# Patient Record
Sex: Female | Born: 1980 | Race: Black or African American | Hispanic: No | Smoking: Never smoker
Health system: Southern US, Community
[De-identification: ages and names within clinical notes are randomized; demographics above are authoritative.]

## PROBLEM LIST (undated history)

## (undated) DIAGNOSIS — D649 Anemia, unspecified: Secondary | ICD-10-CM

## (undated) DIAGNOSIS — B351 Tinea unguium: Secondary | ICD-10-CM

## (undated) DIAGNOSIS — E119 Type 2 diabetes mellitus without complications: Secondary | ICD-10-CM

## (undated) DIAGNOSIS — F419 Anxiety disorder, unspecified: Secondary | ICD-10-CM

## (undated) DIAGNOSIS — R011 Cardiac murmur, unspecified: Secondary | ICD-10-CM

## (undated) HISTORY — PX: NO PAST SURGERIES: SHX2092

---

## 2013-09-22 ENCOUNTER — Emergency Department (HOSPITAL_COMMUNITY)
Admission: EM | Admit: 2013-09-22 | Discharge: 2013-09-22 | Disposition: A | Discharge status: Home / Self Care | Payer: Medicaid - Out of State | Attending: Emergency Medicine | Admitting: Emergency Medicine

## 2013-09-22 ENCOUNTER — Emergency Department (HOSPITAL_COMMUNITY): Payer: Medicaid - Out of State

## 2013-09-22 ENCOUNTER — Encounter (HOSPITAL_COMMUNITY): Payer: Self-pay | Admitting: Emergency Medicine

## 2013-09-22 DIAGNOSIS — O209 Hemorrhage in early pregnancy, unspecified: Secondary | ICD-10-CM | POA: Insufficient documentation

## 2013-09-22 LAB — BASIC METABOLIC PANEL
CO2: 24 mEq/L (ref 19–32)
Chloride: 98 mEq/L (ref 96–112)
Creatinine, Ser: 0.51 mg/dL (ref 0.50–1.10)
GFR calc Af Amer: >90 mL/min (ref >90)
Glucose, Bld: 269 mg/dL — ABNORMAL HIGH (ref 70–99)
Sodium: 136 mEq/L — ABNORMAL LOW (ref 137–147)

## 2013-09-22 LAB — URINALYSIS, ROUTINE W REFLEX MICROSCOPIC
Glucose, UA: >1000 mg/dL — AB
Ketones, ur: 40 mg/dL — AB
Specific Gravity, Urine: >1.046 — ABNORMAL HIGH (ref 1.005–1.030)
pH: 5.5 (ref 5.0–8.0)

## 2013-09-22 LAB — CBC WITH DIFFERENTIAL/PLATELET
Basophils Relative: 0 % (ref 0–1)
Eosinophils Absolute: 0.1 10*3/uL (ref 0.0–0.7)
Eosinophils Relative: 1 % (ref 0–5)
HCT: 42.6 % (ref 36.0–46.0)
Hemoglobin: 14.8 g/dL (ref 12.0–15.0)
MCH: 29.3 pg (ref 26.0–34.0)
MCHC: 34.7 g/dL (ref 30.0–36.0)
MCV: 84.4 fL (ref 78.0–100.0)
Monocytes Absolute: 0.6 10*3/uL (ref 0.1–1.0)
Monocytes Relative: 5 % (ref 3–12)
Neutro Abs: 8.2 10*3/uL — ABNORMAL HIGH (ref 1.7–7.7)
Neutrophils Relative %: 75 % (ref 43–77)
RDW: 13.9 % (ref 11.5–15.5)
WBC: 11 10*3/uL — ABNORMAL HIGH (ref 4.0–10.5)

## 2013-09-22 LAB — ABO/RH: Antibody Screen: NEGATIVE

## 2013-09-22 LAB — URINE MICROSCOPIC-ADD ON

## 2013-09-22 LAB — WET PREP, GENITAL
Clue Cells Wet Prep HPF POC: NONE SEEN
Yeast Wet Prep HPF POC: NONE SEEN

## 2013-09-22 MED ORDER — RHO D IMMUNE GLOBULIN 1500 UNIT/2ML IJ SOLN
300.0000 ug | Freq: Once | INTRAMUSCULAR | Status: AC
  Administered 2013-09-22: 300 ug via INTRAVENOUS

## 2013-09-22 MED ORDER — ACETAMINOPHEN 325 MG PO TABS
650.0000 mg | ORAL_TABLET | Freq: Once | ORAL | Status: AC
  Administered 2013-09-22: 650 mg via ORAL
  Filled 2013-09-22: qty 2

## 2013-09-22 NOTE — ED Provider Notes (Signed)
Medical screening examination/treatment/procedure(s) were performed by non-physician practitioner and as supervising physician I was immediately available for consultation/collaboration.  EKG Interpretation   None         Layla Maw Mykah Shin, DO 09/22/13 1626

## 2013-09-22 NOTE — ED Notes (Signed)
Pt c/o of vaginal bleeding that started a couple of days ago. States that she is [redacted] weeks pregnant. Also c/o of lower abd cramping. Denies n/v/d.

## 2013-09-22 NOTE — Progress Notes (Signed)
   CARE MANAGEMENT ED NOTE 09/22/2013  Patient:  [name], [name]   Account Number:  [id number]  Date Initiated:  09/22/2013  Documentation initiated by:  Edd Arbour  Subjective/Objective Assessment:   32 yr old female medicaid out of state [place] coverage Pt without pcp locally as confirmed by her Pt states she is "not sure" if she is relocating to the [employer] [place] c/o [redacted] weeks pregnant with vaginal bleeding for a couple of days     Subjective/Objective Assessment Detail:   confirms in [place] her pcp was Dr Concha Pyo in [place]     Action/Plan:   CM noted pt with Medicaid of [place] Cm spoke with pt about contacting DSS case worker to assist with medicaid transfer services if she plans to relocate to [facility]   Action/Plan Detail:   CM discussed with pt the need to verify with DSS who is pcp is, how to request a change of medicaid pcp & process for changing medicaid from county to county and from state to state  Pt voiced understanding   Anticipated DC Date:  09/22/2013     Status Recommendation to Physician:   Result of Recommendation:    Other ED Services  Consult Working Plan    DC Planning Services  Other  PCP issues  Outpatient Services - Pt will follow up    Choice offered to / List presented to:            Status of service:  Completed, signed off  ED Comments:   ED Comments Detail:  Cm provided pt with a list of [place] accepting [facility] providers CM discussed and provided written information for self pay pcps, importance of pcp for f/u care, www.needymeds.org, discounted pharmacies and other [place] resources such as financial assistance, DSS and  health department Reviewed resources for [employer] self pay pcps like [facility], family medicine at [address], [facility] family practice, general medical clinics, [facility] urgent care plus others, [facility] out patient pharmacies and housing Pt voiced understanding and appreciation of resources provided

## 2013-09-22 NOTE — ED Notes (Signed)
Patient transported to Ultrasound 

## 2013-09-22 NOTE — ED Provider Notes (Signed)
Early pregnancy with bleeding, requested to assist with bedside US to look for intrauterine pregnancy.  EMERGENCY DEPARTMENT [place] PREGNANCY "Study: Limited Ultrasound of the Pelvis for Pregnancy"  INDICATIONS:Pregnancy(required) and Vaginal bleeding Multiple views of the uterus and pelvic cavity were obtained in real-time with a multi-frequency probe.  APPROACH:Transabdominal   PERFORMED BY: Myself  IMAGES ARCHIVED?: Yes  LIMITATIONS: Body habitus  And empty bladder  PREGNANCY FREE FLUID: None  ADNEXAL FINDINGS: no free fluid seen  PREGNANCY FINDINGS: no intrauterine pregnancy visualized, no free fluid in abdomen  INTERPRETATION: No intrauterine preg, recommend formal transvaginal US; early pregnancy vs threatened abortion vs ectopic  Blane Ohara M 9:08 AM       Enid Skeens, MD 09/22/13 [phone]

## 2013-09-22 NOTE — ED Provider Notes (Signed)
CSN: [id number]     Arrival date & time 09/22/13  4098 History   First MD Initiated Contact with Patient 09/22/13 [phone]     Chief Complaint  Patient presents with  . Vaginal Bleeding  . Possible Pregnancy   (Consider location/radiation/quality/duration/timing/severity/associated sxs/prior Treatment) HPI Comments: Patient presents to the ED with a chief complaint of vaginal bleeding.  She states that she recently found out she is pregnant.  She states that LMP was 8 weeks ago.  She states that the bleeding started 2 days ago.  She states that she has not had any abdominal pain until getting to the ED today, but she thinks that she might just be nervous.  She denies any other vaginal discharge.  She denies any other symptoms.  The history is provided by the patient. No language interpreter was used.    No past medical history on file. No past surgical history on file. No family history on file. History  Substance Use Topics  . Smoking status: Not on file  . Smokeless tobacco: Not on file  . Alcohol Use: Not on file   OB History   No data available     Review of Systems  All other systems reviewed and are negative.    Allergies  Review of patient's allergies indicates not on file.  Home Medications  No current outpatient prescriptions on file. BP 140/89  Pulse 113  Temp(Src) 98.5 F (36.9 C) (Oral)  Resp 16  SpO2 100% Physical Exam  Nursing note and vitals reviewed. Constitutional: She is oriented to person, place, and time. She appears well-developed and well-nourished.  HENT:  Head: Normocephalic and atraumatic.  Eyes: Conjunctivae and EOM are normal. Pupils are equal, round, and reactive to light.  Neck: Normal range of motion. Neck supple.  Cardiovascular: Normal rate and regular rhythm.  Exam reveals no gallop and no friction rub.   No murmur heard. Pulmonary/Chest: Effort normal and breath sounds normal. No respiratory distress. She has no wheezes. She has no  rales. She exhibits no tenderness.  Abdominal: Soft. She exhibits no distension and no mass. There is no tenderness. There is no rebound and no guarding.  No focal abdominal tenderness  Genitourinary: No labial fusion. There is no rash, tenderness, lesion or injury on the right labia. There is no rash, tenderness, lesion or injury on the left labia. Uterus is not deviated, not enlarged, not fixed and not tender. Cervix exhibits no motion tenderness, no discharge and no friability. Right adnexum displays no mass, no tenderness and no fullness. Left adnexum displays no mass, no tenderness and no fullness. There is bleeding around the vagina. No erythema or tenderness around the vagina. No foreign body around the vagina. No signs of injury around the vagina. Vaginal discharge found.  Chaperone present for pelvic exam  Musculoskeletal: Normal range of motion. She exhibits no edema and no tenderness.  Neurological: She is alert and oriented to person, place, and time.  Skin: Skin is warm and dry.  Psychiatric: She has a normal mood and affect. Her behavior is normal. Judgment and thought content normal.    ED Course  Procedures (including critical care time) Results for orders placed during the hospital encounter of 09/22/13  CBC WITH DIFFERENTIAL      Result Value Range   WBC 11.0 (*) 4.0 - 10.5 K/uL   RBC 5.05  3.87 - 5.11 MIL/uL   Hemoglobin 14.8  12.0 - 15.0 g/dL   HCT 47.8  29.5 - 62.1 %  MCV 84.4  78.0 - 100.0 fL   MCH 29.3  26.0 - 34.0 pg   MCHC 34.7  30.0 - 36.0 g/dL   RDW 47.8  29.5 - 62.1 %   Platelets 309  150 - 400 K/uL   Neutrophils Relative % 75  43 - 77 %   Neutro Abs 8.2 (*) 1.7 - 7.7 K/uL   Lymphocytes Relative 19  12 - 46 %   Lymphs Abs 2.1  0.7 - 4.0 K/uL   Monocytes Relative 5  3 - 12 %   Monocytes Absolute 0.6  0.1 - 1.0 K/uL   Eosinophils Relative 1  0 - 5 %   Eosinophils Absolute 0.1  0.0 - 0.7 K/uL   Basophils Relative 0  0 - 1 %   Basophils Absolute 0.0  0.0 -  0.1 K/uL  HCG, QUANTITATIVE, PREGNANCY      Result Value Range   hCG, Beta Chain, Quant, S 681 (*) <5 mIU/mL  BASIC METABOLIC PANEL      Result Value Range   Sodium 136 (*) 137 - 147 mEq/L   Potassium 4.0  3.7 - 5.3 mEq/L   Chloride 98  96 - 112 mEq/L   CO2 24  19 - 32 mEq/L   Glucose, Bld 269 (*) 70 - 99 mg/dL   BUN 10  6 - 23 mg/dL   Creatinine, Ser 3.08  0.50 - 1.10 mg/dL   Calcium 9.4  8.4 - 65.7 mg/dL   GFR calc non Af Amer >90  >90 mL/min   GFR calc Af Amer >90  >90 mL/min  URINALYSIS, ROUTINE W REFLEX MICROSCOPIC      Result Value Range   Color, Urine AMBER (*) YELLOW   APPearance CLOUDY (*) CLEAR   Specific Gravity, Urine >1.046 (*) 1.005 - 1.030   pH 5.5  5.0 - 8.0   Glucose, UA >1000 (*) NEGATIVE mg/dL   Hgb urine dipstick LARGE (*) NEGATIVE   Bilirubin Urine SMALL (*) NEGATIVE   Ketones, ur 40 (*) NEGATIVE mg/dL   Protein, ur 30 (*) NEGATIVE mg/dL   Urobilinogen, UA 1.0  0.0 - 1.0 mg/dL   Nitrite NEGATIVE  NEGATIVE   Leukocytes, UA SMALL (*) NEGATIVE  URINE MICROSCOPIC-ADD ON      Result Value Range   Squamous Epithelial / LPF MANY (*) RARE   WBC, UA 11-20  <3 WBC/hpf   RBC / HPF TOO NUMEROUS TO COUNT  <3 RBC/hpf   Bacteria, UA FEW (*) RARE   Urine-Other MUCOUS PRESENT    POCT PREGNANCY, URINE      Result Value Range   Preg Test, Ur POSITIVE (*) NEGATIVE  ABO/RH      Result Value Range   ABO/RH(D) O NEG     Antibody Screen NEG     US Ob Comp Less 14 Wks  09/22/2013   CLINICAL DATA:  Vaginal bleeding.  Possible pregnancy.  EXAM: OBSTETRIC <14 WK [place] AND TRANSVAGINAL OB US  TECHNIQUE: Both transabdominal and transvaginal ultrasound examinations were performed for complete evaluation of the gestation as well as the maternal uterus, adnexal regions, and pelvic cul-de-sac. Transvaginal technique was performed to assess early pregnancy.  COMPARISON:  None.  FINDINGS: Intrauterine gestational sac: No intrauterine gestational sac.  Yolk sac:  None  Embryo:  None   Cardiac Activity: None  Heart Rate:  None  Maternal uterus/adnexae: Uterus itself appears normal. No evidence of leiomyoma. Endometrium appears uniform without fluid in the endometrial canal. Endometrium may  be slightly proliferative.  Left ovary appears normal measuring 3.1 x 1.9 x 2.6 cm.  Right ovary measures 3.3 x 1.5 x 1.5 cm. Immediately adjacent to the right ovary, there is an echogenic area measuring approximately 2.0 x 1.2 x 1.5 cm with a cystic area within it. The differential diagnosis for this is hemorrhagic ovarian cyst in evolution versus the possibility of an ectopic pregnancy. Follow-up is suggested. Internal blood flow is not demonstrated, somewhat favoring hemorrhagic cyst.  There is a trace amount of free fluid.  IMPRESSION: No evidence of intrauterine pregnancy.  Normal appearing left ovary.  2 x 1.2 x 1.5 cm focus immediately adjacent to the right ovary which is largely echogenic but contains a small cystic area. This could represent an evolving hemorrhagic cyst. However, ectopic pregnancy is not excluded. Follow-up suggested.   Electronically Signed   By: Paulina Fusi M.D.   On: 09/22/2013 10:53   US Ob Transvaginal  09/22/2013   CLINICAL DATA:  Vaginal bleeding.  Possible pregnancy.  EXAM: OBSTETRIC <14 WK [place] AND TRANSVAGINAL OB US  TECHNIQUE: Both transabdominal and transvaginal ultrasound examinations were performed for complete evaluation of the gestation as well as the maternal uterus, adnexal regions, and pelvic cul-de-sac. Transvaginal technique was performed to assess early pregnancy.  COMPARISON:  None.  FINDINGS: Intrauterine gestational sac: No intrauterine gestational sac.  Yolk sac:  None  Embryo:  None  Cardiac Activity: None  Heart Rate:  None  Maternal uterus/adnexae: Uterus itself appears normal. No evidence of leiomyoma. Endometrium appears uniform without fluid in the endometrial canal. Endometrium may be slightly proliferative.  Left ovary appears normal measuring 3.1 x  1.9 x 2.6 cm.  Right ovary measures 3.3 x 1.5 x 1.5 cm. Immediately adjacent to the right ovary, there is an echogenic area measuring approximately 2.0 x 1.2 x 1.5 cm with a cystic area within it. The differential diagnosis for this is hemorrhagic ovarian cyst in evolution versus the possibility of an ectopic pregnancy. Follow-up is suggested. Internal blood flow is not demonstrated, somewhat favoring hemorrhagic cyst.  There is a trace amount of free fluid.  IMPRESSION: No evidence of intrauterine pregnancy.  Normal appearing left ovary.  2 x 1.2 x 1.5 cm focus immediately adjacent to the right ovary which is largely echogenic but contains a small cystic area. This could represent an evolving hemorrhagic cyst. However, ectopic pregnancy is not excluded. Follow-up suggested.   Electronically Signed   By: Paulina Fusi M.D.   On: 09/22/2013 10:53      EKG Interpretation   None       MDM   1. Vaginal bleeding in pregnancy, first trimester      Patient has positive pregnancy test with vaginal bleeding.  Rh-, will give rhogam.  US shows probable cyst, but ectopic not excluded.  Probable miscarriage. [facility].  Discussed the patient with Dr. Elesa Massed, who agrees with the plan.    Roxy Horseman, PA-C 09/22/13 [phone]

## 2013-09-23 LAB — RH IG WORKUP (INCLUDES ABO/RH)
ABO/RH(D): O NEG
Unit division: 0

## 2013-09-23 LAB — URINE CULTURE: Colony Count: >=100000

## 2014-07-26 ENCOUNTER — Encounter (HOSPITAL_COMMUNITY): Payer: Self-pay | Admitting: Emergency Medicine

## 2014-11-18 IMAGING — US US OB COMP LESS 14 WK
1 series · 13 of 28 positions shown · non-contrast
Comparison: None.

CLINICAL DATA: Vaginal bleeding.  Possible pregnancy.

EXAM:
OBSTETRIC <14 WK US AND TRANSVAGINAL OB US
TECHNIQUE: Both transabdominal and transvaginal ultrasound examinations were
performed for complete evaluation of the gestation as well as the
maternal uterus, adnexal regions, and pelvic cul-de-sac.
Transvaginal technique was performed to assess early pregnancy.

[Series 1: us ob comp less 14 wk · 0.20mm/px · 13 of 56 slices shown]
[im 3/56]
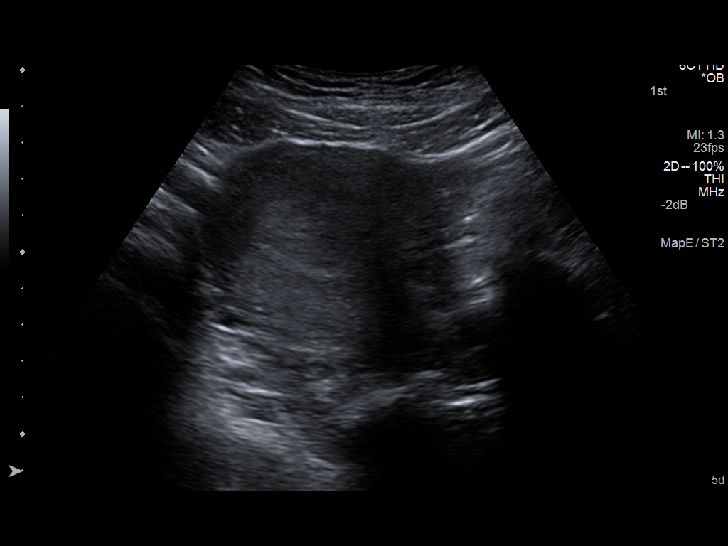
[im 7/56]
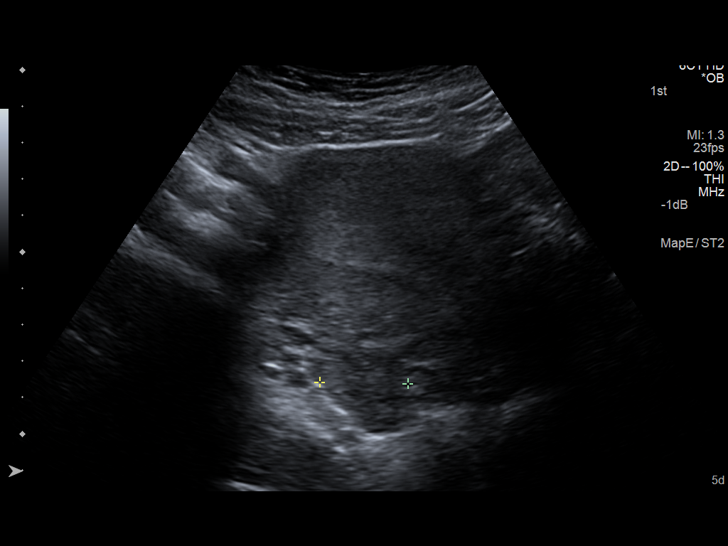
[im 11/56]
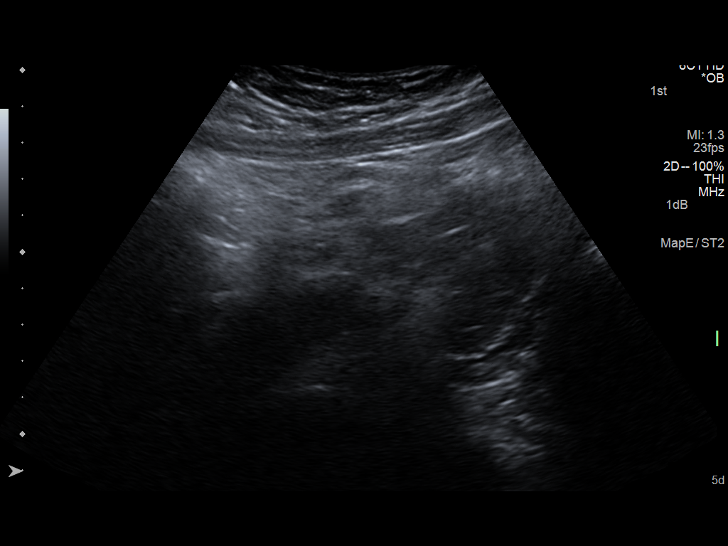
[im 15/56]
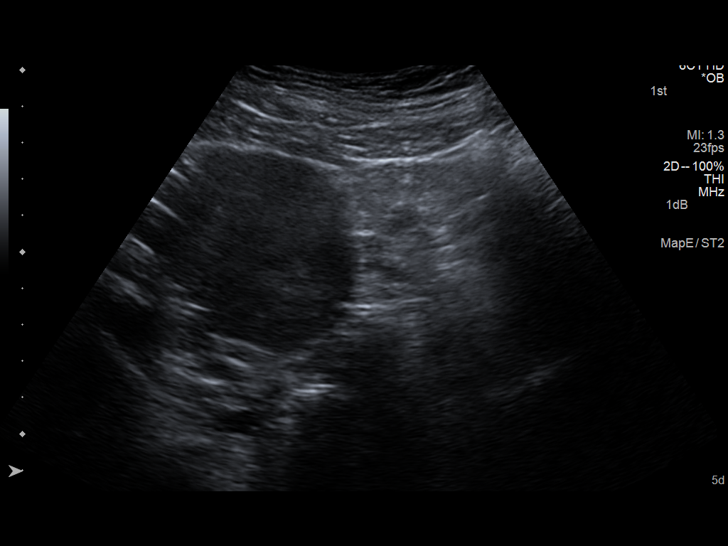
[im 19/56]
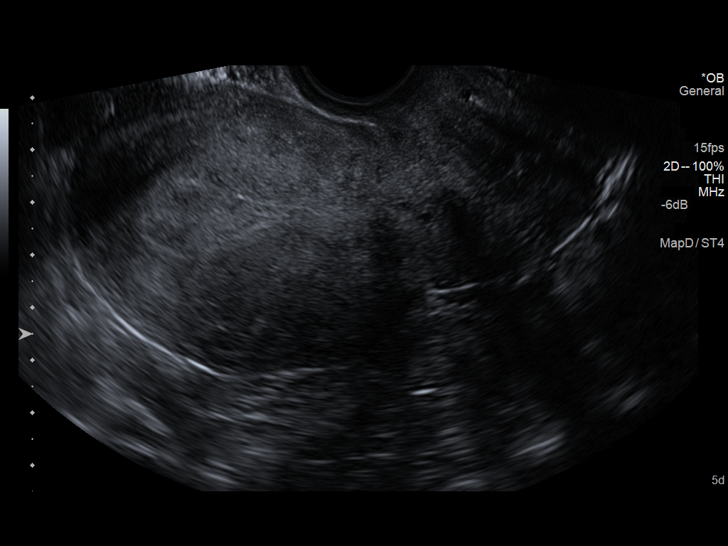
[im 23/56]
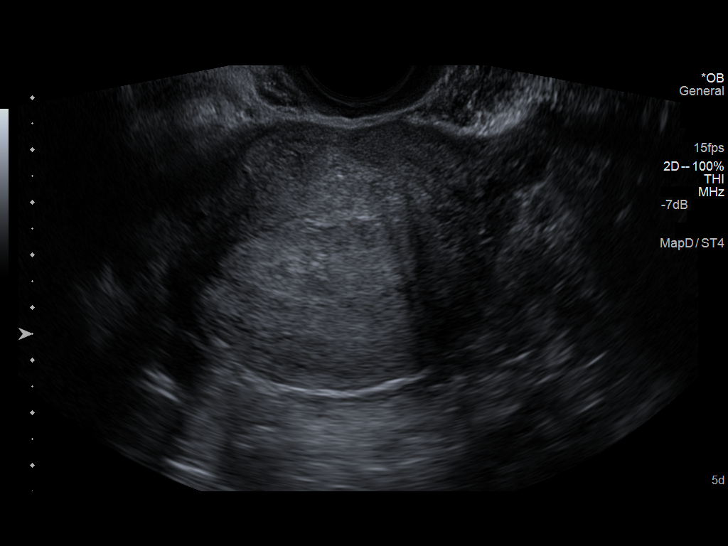
[im 29/56]
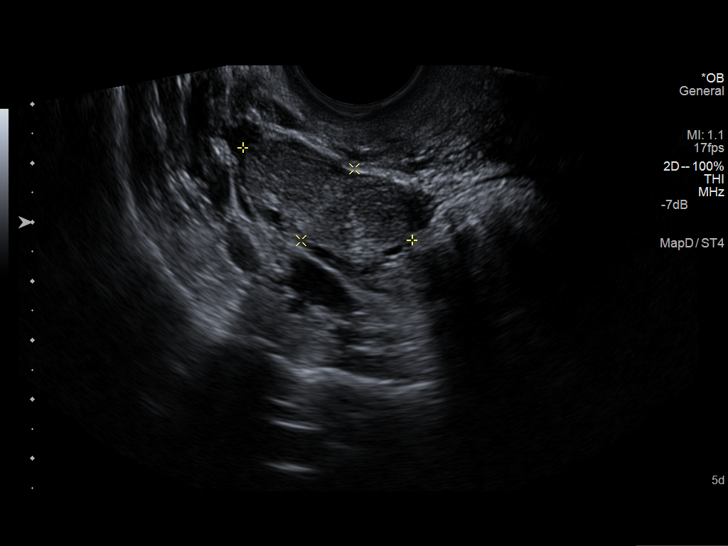
[im 33/56]
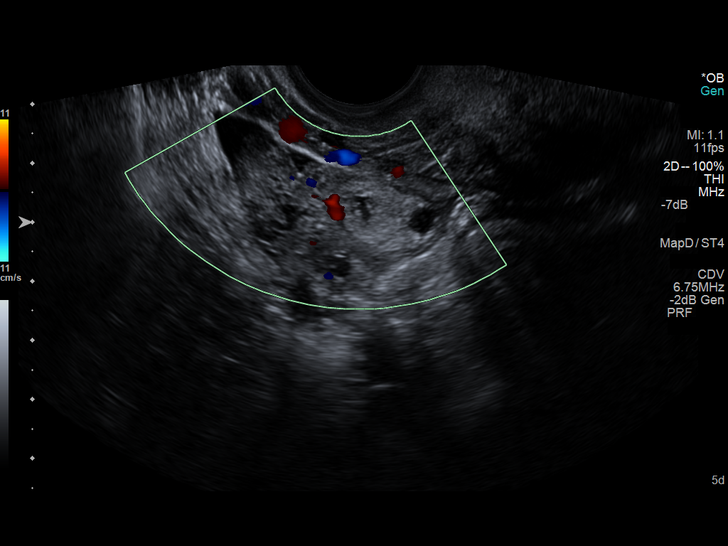
[im 37/56]
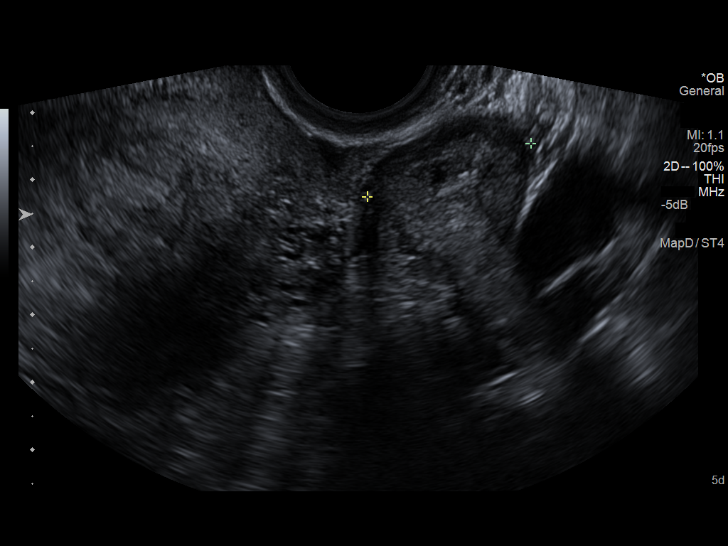
[im 41/56]
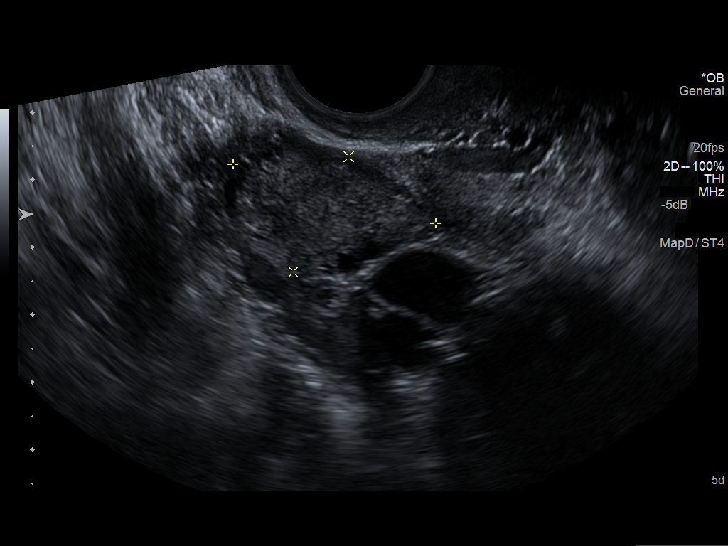
[im 45/56]
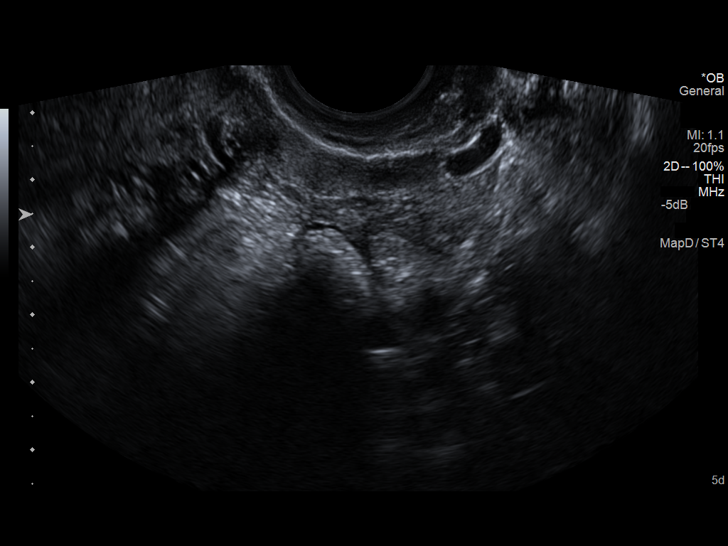
[im 49/56]
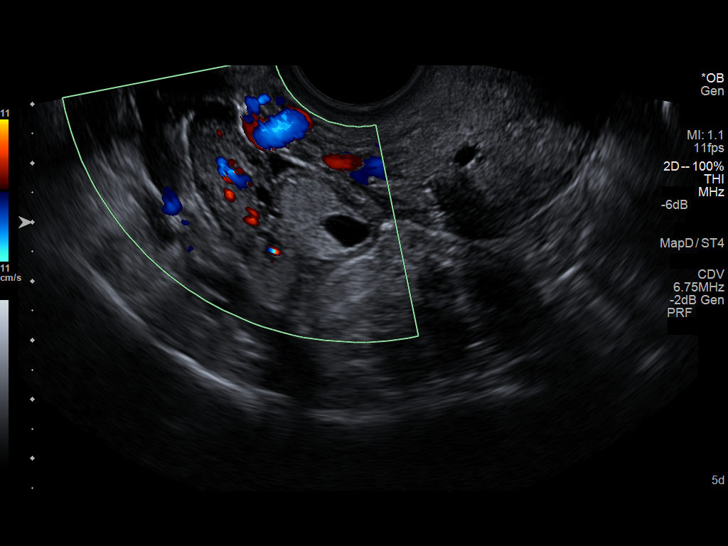
[im 53/56]
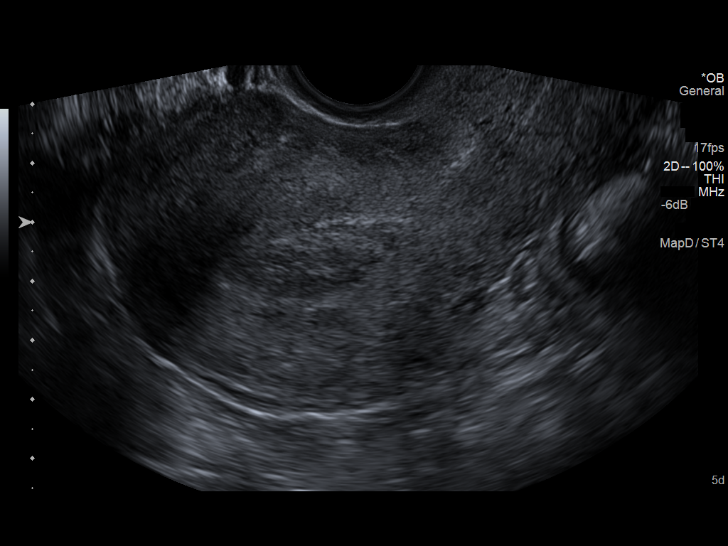

[13 of 28 positions shown; findings below may reference images not displayed]

FINDINGS: Intrauterine gestational sac: No intrauterine gestational sac.

Yolk sac:  None

Embryo:  None

Cardiac Activity: None

Heart Rate:  None

Maternal uterus/adnexae: Uterus itself appears normal. No evidence
of leiomyoma. Endometrium appears uniform without fluid in the
endometrial canal. Endometrium may be slightly proliferative.

Left ovary appears normal measuring 3.1 x 1.9 x 2.6 cm.

Right ovary measures 3.3 x 1.5 x 1.5 cm. Immediately adjacent to the
right ovary, there is an echogenic area measuring approximately
x 1.2 x 1.5 cm with a cystic area within it. The differential
diagnosis for this is hemorrhagic ovarian cyst in evolution versus
the possibility of an ectopic pregnancy. Follow-up is suggested.
Internal blood flow is not demonstrated, somewhat favoring
hemorrhagic cyst.

There is a trace amount of free fluid.
IMPRESSION: No evidence of intrauterine pregnancy.

Normal appearing left ovary.

2 x 1.2 x 1.5 cm focus immediately adjacent to the right ovary which
is largely echogenic but contains a small cystic area. This could
represent an evolving hemorrhagic cyst. However, ectopic pregnancy
is not excluded. Follow-up suggested.

## 2018-07-11 ENCOUNTER — Encounter: Payer: Self-pay | Admitting: Emergency Medicine

## 2018-07-11 ENCOUNTER — Other Ambulatory Visit: Payer: Self-pay

## 2018-07-11 ENCOUNTER — Emergency Department
Admission: EM | Admit: 2018-07-11 | Discharge: 2018-07-11 | Disposition: A | Discharge status: Home / Self Care | Payer: 59 | Attending: Emergency Medicine | Admitting: Emergency Medicine

## 2018-07-11 DIAGNOSIS — S39012A Strain of muscle, fascia and tendon of lower back, initial encounter: Secondary | ICD-10-CM | POA: Insufficient documentation

## 2018-07-11 DIAGNOSIS — Y999 Unspecified external cause status: Secondary | ICD-10-CM | POA: Insufficient documentation

## 2018-07-11 DIAGNOSIS — Y939 Activity, unspecified: Secondary | ICD-10-CM | POA: Insufficient documentation

## 2018-07-11 DIAGNOSIS — Y929 Unspecified place or not applicable: Secondary | ICD-10-CM | POA: Diagnosis not present

## 2018-07-11 DIAGNOSIS — S161XXA Strain of muscle, fascia and tendon at neck level, initial encounter: Secondary | ICD-10-CM

## 2018-07-11 DIAGNOSIS — S199XXA Unspecified injury of neck, initial encounter: Secondary | ICD-10-CM | POA: Diagnosis present

## 2018-07-11 MED ORDER — NAPROXEN 500 MG PO TABS: 500.0000 mg | ORAL_TABLET | Freq: Two times a day (BID) | ORAL | 0 refills | Status: AC

## 2018-07-11 MED ORDER — CYCLOBENZAPRINE HCL 5 MG PO TABS: ORAL_TABLET | ORAL | 0 refills | Status: DC

## 2018-07-11 MED ORDER — TRAMADOL HCL 50 MG PO TABS: 50.0000 mg | ORAL_TABLET | Freq: Four times a day (QID) | ORAL | 0 refills | Status: AC | PRN
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
## 2018-07-11 NOTE — ED Notes (Signed)

## 2021-07-09 ENCOUNTER — Encounter (HOSPITAL_COMMUNITY): Payer: Self-pay | Admitting: *Deleted

## 2021-07-09 ENCOUNTER — Inpatient Hospital Stay (HOSPITAL_COMMUNITY): Payer: 59

## 2021-07-09 ENCOUNTER — Inpatient Hospital Stay (HOSPITAL_COMMUNITY)
Admission: EM | Admit: 2021-07-09 | Discharge: 2021-07-12 | DRG: 872 | Disposition: A | Discharge status: Home / Self Care | Payer: 59 | Attending: Internal Medicine | Admitting: Internal Medicine

## 2021-07-09 ENCOUNTER — Emergency Department (HOSPITAL_COMMUNITY): Payer: 59

## 2021-07-09 ENCOUNTER — Other Ambulatory Visit: Payer: Self-pay

## 2021-07-09 DIAGNOSIS — I959 Hypotension, unspecified: Secondary | ICD-10-CM | POA: Diagnosis not present

## 2021-07-09 DIAGNOSIS — N390 Urinary tract infection, site not specified: Secondary | ICD-10-CM | POA: Diagnosis not present

## 2021-07-09 DIAGNOSIS — A419 Sepsis, unspecified organism: Principal | ICD-10-CM | POA: Diagnosis present

## 2021-07-09 DIAGNOSIS — E878 Other disorders of electrolyte and fluid balance, not elsewhere classified: Secondary | ICD-10-CM | POA: Diagnosis present

## 2021-07-09 DIAGNOSIS — N179 Acute kidney failure, unspecified: Secondary | ICD-10-CM | POA: Diagnosis present

## 2021-07-09 DIAGNOSIS — Z794 Long term (current) use of insulin: Secondary | ICD-10-CM

## 2021-07-09 DIAGNOSIS — R6510 Systemic inflammatory response syndrome (SIRS) of non-infectious origin without acute organ dysfunction: Principal | ICD-10-CM | POA: Diagnosis present

## 2021-07-09 DIAGNOSIS — E860 Dehydration: Secondary | ICD-10-CM | POA: Diagnosis present

## 2021-07-09 DIAGNOSIS — E876 Hypokalemia: Secondary | ICD-10-CM | POA: Diagnosis present

## 2021-07-09 DIAGNOSIS — R112 Nausea with vomiting, unspecified: Secondary | ICD-10-CM | POA: Diagnosis present

## 2021-07-09 DIAGNOSIS — Z833 Family history of diabetes mellitus: Secondary | ICD-10-CM | POA: Diagnosis not present

## 2021-07-09 DIAGNOSIS — D75839 Thrombocytosis, unspecified: Secondary | ICD-10-CM | POA: Diagnosis present

## 2021-07-09 DIAGNOSIS — B351 Tinea unguium: Secondary | ICD-10-CM | POA: Diagnosis present

## 2021-07-09 DIAGNOSIS — R350 Frequency of micturition: Secondary | ICD-10-CM | POA: Diagnosis present

## 2021-07-09 DIAGNOSIS — Z79899 Other long term (current) drug therapy: Secondary | ICD-10-CM

## 2021-07-09 DIAGNOSIS — E871 Hypo-osmolality and hyponatremia: Secondary | ICD-10-CM | POA: Diagnosis present

## 2021-07-09 DIAGNOSIS — R1110 Vomiting, unspecified: Secondary | ICD-10-CM

## 2021-07-09 DIAGNOSIS — D72829 Elevated white blood cell count, unspecified: Secondary | ICD-10-CM | POA: Diagnosis present

## 2021-07-09 DIAGNOSIS — R110 Nausea: Secondary | ICD-10-CM | POA: Diagnosis present

## 2021-07-09 DIAGNOSIS — Z20822 Contact with and (suspected) exposure to covid-19: Secondary | ICD-10-CM | POA: Diagnosis present

## 2021-07-09 DIAGNOSIS — R739 Hyperglycemia, unspecified: Secondary | ICD-10-CM

## 2021-07-09 DIAGNOSIS — E1165 Type 2 diabetes mellitus with hyperglycemia: Secondary | ICD-10-CM | POA: Diagnosis present

## 2021-07-09 DIAGNOSIS — R17 Unspecified jaundice: Secondary | ICD-10-CM | POA: Diagnosis present

## 2021-07-09 DIAGNOSIS — D509 Iron deficiency anemia, unspecified: Secondary | ICD-10-CM | POA: Diagnosis present

## 2021-07-09 HISTORY — DX: Systemic inflammatory response syndrome (sirs) of non-infectious origin without acute organ dysfunction: R65.10

## 2021-07-09 HISTORY — DX: Tinea unguium: B35.1

## 2021-07-09 LAB — I-STAT VENOUS BLOOD GAS, ED
Acid-Base Excess: 4 mmol/L — ABNORMAL HIGH (ref 0.0–2.0)
Bicarbonate: 26.4 mmol/L (ref 20.0–28.0)
Calcium, Ion: 0.95 mmol/L — ABNORMAL LOW (ref 1.15–1.40)
HCT: 41 % (ref 36.0–46.0)
Hemoglobin: 13.9 g/dL (ref 12.0–15.0)
O2 Saturation: 90 %
Potassium: 2.8 mmol/L — ABNORMAL LOW (ref 3.5–5.1)
Sodium: 124 mmol/L — ABNORMAL LOW (ref 135–145)
TCO2: 27 mmol/L (ref 22–32)
pCO2, Ven: 33.9 mmHg — ABNORMAL LOW (ref 44.0–60.0)
pH, Ven: 7.5 — ABNORMAL HIGH (ref 7.250–7.430)
pO2, Ven: 52 mmHg — ABNORMAL HIGH (ref 32.0–45.0)

## 2021-07-09 LAB — COMPREHENSIVE METABOLIC PANEL
ALT: 13 U/L (ref 0–44)
AST: 13 U/L — ABNORMAL LOW (ref 15–41)
Albumin: 3.3 g/dL — ABNORMAL LOW (ref 3.5–5.0)
Alkaline Phosphatase: 63 U/L (ref 38–126)
Anion gap: 24 — ABNORMAL HIGH (ref 5–15)
BUN: 21 mg/dL — ABNORMAL HIGH (ref 6–20)
CO2: 23 mmol/L (ref 22–32)
Calcium: 8.6 mg/dL — ABNORMAL LOW (ref 8.9–10.3)
Chloride: 75 mmol/L — ABNORMAL LOW (ref 98–111)
Creatinine, Ser: 1.33 mg/dL — ABNORMAL HIGH (ref 0.44–1.00)
GFR, Estimated: 52 mL/min — ABNORMAL LOW (ref >60)
Glucose, Bld: 484 mg/dL — ABNORMAL HIGH (ref 70–99)
Potassium: 2.9 mmol/L — ABNORMAL LOW (ref 3.5–5.1)
Sodium: 122 mmol/L — ABNORMAL LOW (ref 135–145)
Total Bilirubin: 1.3 mg/dL — ABNORMAL HIGH (ref 0.3–1.2)
Total Protein: 7.1 g/dL (ref 6.5–8.1)

## 2021-07-09 LAB — MAGNESIUM: Magnesium: 2.1 mg/dL (ref 1.7–2.4)

## 2021-07-09 LAB — RESP PANEL BY RT-PCR (FLU A&B, COVID) ARPGX2
Influenza A by PCR: NEGATIVE
Influenza B by PCR: NEGATIVE
SARS Coronavirus 2 by RT PCR: NEGATIVE

## 2021-07-09 LAB — HIV ANTIBODY (ROUTINE TESTING W REFLEX): HIV Screen 4th Generation wRfx: NONREACTIVE

## 2021-07-09 LAB — CBC WITH DIFFERENTIAL/PLATELET
Abs Immature Granulocytes: 0.13 10*3/uL — ABNORMAL HIGH (ref 0.00–0.07)
Basophils Absolute: 0.1 10*3/uL (ref 0.0–0.1)
Basophils Relative: 1 %
Eosinophils Absolute: 0.1 10*3/uL (ref 0.0–0.5)
Eosinophils Relative: 0 %
HCT: 40.4 % (ref 36.0–46.0)
Hemoglobin: 13.4 g/dL (ref 12.0–15.0)
Immature Granulocytes: 1 %
Lymphocytes Relative: 23 %
Lymphs Abs: 3.9 10*3/uL (ref 0.7–4.0)
MCH: 25 pg — ABNORMAL LOW (ref 26.0–34.0)
MCHC: 33.2 g/dL (ref 30.0–36.0)
MCV: 75.5 fL — ABNORMAL LOW (ref 80.0–100.0)
Monocytes Absolute: 1.4 10*3/uL — ABNORMAL HIGH (ref 0.1–1.0)
Monocytes Relative: 8 %
Neutro Abs: 11.1 10*3/uL — ABNORMAL HIGH (ref 1.7–7.7)
Neutrophils Relative %: 67 %
Platelets: 550 10*3/uL — ABNORMAL HIGH (ref 150–400)
RBC: 5.35 MIL/uL — ABNORMAL HIGH (ref 3.87–5.11)
RDW: 13.8 % (ref 11.5–15.5)
WBC: 16.7 10*3/uL — ABNORMAL HIGH (ref 4.0–10.5)
nRBC: 0 % (ref 0.0–0.2)

## 2021-07-09 LAB — CBG MONITORING, ED
Glucose-Capillary: 363 mg/dL — ABNORMAL HIGH (ref 70–99)
Glucose-Capillary: 266 mg/dL — ABNORMAL HIGH (ref 70–99)
Glucose-Capillary: 205 mg/dL — ABNORMAL HIGH (ref 70–99)
Glucose-Capillary: 228 mg/dL — ABNORMAL HIGH (ref 70–99)
Glucose-Capillary: 253 mg/dL — ABNORMAL HIGH (ref 70–99)
Glucose-Capillary: 211 mg/dL — ABNORMAL HIGH (ref 70–99)
Glucose-Capillary: 186 mg/dL — ABNORMAL HIGH (ref 70–99)
Glucose-Capillary: 161 mg/dL — ABNORMAL HIGH (ref 70–99)

## 2021-07-09 LAB — BASIC METABOLIC PANEL
Anion gap: 15 (ref 5–15)
Anion gap: 12 (ref 5–15)
BUN: 17 mg/dL (ref 6–20)
BUN: 14 mg/dL (ref 6–20)
CO2: 25 mmol/L (ref 22–32)
CO2: 26 mmol/L (ref 22–32)
Calcium: 7.8 mg/dL — ABNORMAL LOW (ref 8.9–10.3)
Calcium: 7.9 mg/dL — ABNORMAL LOW (ref 8.9–10.3)
Chloride: 87 mmol/L — ABNORMAL LOW (ref 98–111)
Chloride: 90 mmol/L — ABNORMAL LOW (ref 98–111)
Creatinine, Ser: 0.98 mg/dL (ref 0.44–1.00)
Creatinine, Ser: 0.92 mg/dL (ref 0.44–1.00)
GFR, Estimated: >60 mL/min (ref >60)
GFR, Estimated: >60 mL/min (ref >60)
Glucose, Bld: 212 mg/dL — ABNORMAL HIGH (ref 70–99)
Glucose, Bld: 262 mg/dL — ABNORMAL HIGH (ref 70–99)
Potassium: 2.8 mmol/L — ABNORMAL LOW (ref 3.5–5.1)
Potassium: 3.1 mmol/L — ABNORMAL LOW (ref 3.5–5.1)
Sodium: 127 mmol/L — ABNORMAL LOW (ref 135–145)
Sodium: 128 mmol/L — ABNORMAL LOW (ref 135–145)

## 2021-07-09 LAB — SALICYLATE LEVEL: Salicylate Lvl: <7 mg/dL — ABNORMAL LOW (ref 7.0–30.0)

## 2021-07-09 LAB — SEDIMENTATION RATE: Sed Rate: 23 mm/hr — ABNORMAL HIGH (ref 0–22)

## 2021-07-09 LAB — I-STAT BETA HCG BLOOD, ED (MC, WL, AP ONLY): I-stat hCG, quantitative: <5 m[IU]/mL (ref <5)

## 2021-07-09 LAB — BETA-HYDROXYBUTYRIC ACID: Beta-Hydroxybutyric Acid: 7.02 mmol/L — ABNORMAL HIGH (ref 0.05–0.27)

## 2021-07-09 LAB — LACTIC ACID, PLASMA: Lactic Acid, Venous: 1.9 mmol/L (ref 0.5–1.9)

## 2021-07-09 LAB — ACETAMINOPHEN LEVEL: Acetaminophen (Tylenol), Serum: <10 ug/mL — ABNORMAL LOW (ref 10–30)

## 2021-07-09 LAB — HEMOGLOBIN A1C
Hgb A1c MFr Bld: 12.9 % — ABNORMAL HIGH (ref 4.8–5.6)
Mean Plasma Glucose: 323.53 mg/dL

## 2021-07-09 LAB — OSMOLALITY: Osmolality: 290 mOsm/kg (ref 275–295)

## 2021-07-09 LAB — TSH: TSH: 1.286 u[IU]/mL (ref 0.350–4.500)

## 2021-07-09 LAB — PROCALCITONIN: Procalcitonin: <0.1 ng/mL

## 2021-07-09 LAB — LIPASE, BLOOD: Lipase: 24 U/L (ref 11–51)

## 2021-07-09 LAB — C-REACTIVE PROTEIN: CRP: 0.7 mg/dL (ref <1.0)

## 2021-07-09 MED ORDER — SODIUM CHLORIDE 0.9 % IV SOLN: INTRAVENOUS | Status: DC

## 2021-07-09 MED ORDER — IOHEXOL 350 MG/ML SOLN
100.0000 mL | Freq: Once | INTRAVENOUS | Status: AC | PRN
  Administered 2021-07-09: 100 mL via INTRAVENOUS

## 2021-07-09 MED ORDER — DEXTROSE IN LACTATED RINGERS 5 % IV SOLN: INTRAVENOUS | Status: DC

## 2021-07-09 MED ORDER — INSULIN REGULAR(HUMAN) IN NACL 100-0.9 UT/100ML-% IV SOLN
INTRAVENOUS | Status: DC
  Administered 2021-07-09: 5.5 [IU]/h via INTRAVENOUS
  Filled 2021-07-09: qty 100

## 2021-07-09 MED ORDER — CALCIUM GLUCONATE-NACL 1-0.675 GM/50ML-% IV SOLN
1.0000 g | Freq: Once | INTRAVENOUS | Status: AC
  Administered 2021-07-09: 1000 mg via INTRAVENOUS
  Filled 2021-07-09: qty 50

## 2021-07-09 MED ORDER — POTASSIUM CHLORIDE 10 MEQ/100ML IV SOLN
10.0000 meq | INTRAVENOUS | Status: AC
Start: 2021-07-09 — End: 2021-07-09
  Administered 2021-07-09 (×2): 10 meq via INTRAVENOUS
  Filled 2021-07-09 (×2): qty 100

## 2021-07-09 MED ORDER — ALBUTEROL SULFATE (2.5 MG/3ML) 0.083% IN NEBU: 2.5000 mg | INHALATION_SOLUTION | Freq: Four times a day (QID) | RESPIRATORY_TRACT | Status: DC | PRN

## 2021-07-09 MED ORDER — POTASSIUM CHLORIDE 10 MEQ/100ML IV SOLN
10.0000 meq | INTRAVENOUS | Status: AC
Start: 2021-07-09 — End: 2021-07-09
  Administered 2021-07-09 (×4): 10 meq via INTRAVENOUS
  Filled 2021-07-09 (×3): qty 100

## 2021-07-09 MED ORDER — SODIUM CHLORIDE 0.9 % IV SOLN: 1000.0000 mL | INTRAVENOUS | Status: DC

## 2021-07-09 MED ORDER — PROCHLORPERAZINE EDISYLATE 10 MG/2ML IJ SOLN
10.0000 mg | Freq: Once | INTRAMUSCULAR | Status: AC
  Administered 2021-07-09: 10 mg via INTRAVENOUS
  Filled 2021-07-09: qty 2

## 2021-07-09 MED ORDER — ONDANSETRON HCL 4 MG PO TABS
4.0000 mg | ORAL_TABLET | Freq: Four times a day (QID) | ORAL | Status: DC | PRN
  Administered 2021-07-11: 4 mg via ORAL
  Filled 2021-07-09 (×2): qty 1

## 2021-07-09 MED ORDER — VANCOMYCIN HCL 1.25 G IV SOLR
1250.0000 mg | INTRAVENOUS | Status: DC
  Filled 2021-07-09: qty 25

## 2021-07-09 MED ORDER — SODIUM CHLORIDE 0.9 % IV SOLN
2.0000 g | Freq: Once | INTRAVENOUS | Status: AC
  Administered 2021-07-09: 2 g via INTRAVENOUS
  Filled 2021-07-09: qty 2

## 2021-07-09 MED ORDER — POTASSIUM CHLORIDE CRYS ER 20 MEQ PO TBCR
40.0000 meq | EXTENDED_RELEASE_TABLET | ORAL | Status: AC
  Administered 2021-07-09: 40 meq via ORAL
  Filled 2021-07-09: qty 2

## 2021-07-09 MED ORDER — ONDANSETRON 4 MG PO TBDP
4.0000 mg | ORAL_TABLET | Freq: Once | ORAL | Status: AC
  Administered 2021-07-09: 4 mg via ORAL
  Filled 2021-07-09: qty 1

## 2021-07-09 MED ORDER — VANCOMYCIN HCL 1.25 G IV SOLR: 1250.0000 mg | INTRAVENOUS | Status: DC

## 2021-07-09 MED ORDER — SODIUM CHLORIDE 0.9 % IV BOLUS (SEPSIS)
1000.0000 mL | Freq: Once | INTRAVENOUS | Status: AC
  Administered 2021-07-09: 1000 mL via INTRAVENOUS

## 2021-07-09 MED ORDER — ACETAMINOPHEN 650 MG RE SUPP: 650.0000 mg | Freq: Four times a day (QID) | RECTAL | Status: DC | PRN

## 2021-07-09 MED ORDER — ACETAMINOPHEN 325 MG PO TABS: 650.0000 mg | ORAL_TABLET | Freq: Four times a day (QID) | ORAL | Status: DC | PRN

## 2021-07-09 MED ORDER — SODIUM CHLORIDE 0.9 % IV SOLN
2.0000 g | Freq: Two times a day (BID) | INTRAVENOUS | Status: DC
  Administered 2021-07-09 – 2021-07-10 (×2): 2 g via INTRAVENOUS
  Filled 2021-07-09 (×2): qty 2

## 2021-07-09 MED ORDER — ONDANSETRON HCL 4 MG/2ML IJ SOLN
4.0000 mg | Freq: Four times a day (QID) | INTRAMUSCULAR | Status: DC | PRN
  Administered 2021-07-09 – 2021-07-10 (×4): 4 mg via INTRAVENOUS
  Filled 2021-07-09 (×4): qty 2

## 2021-07-09 MED ORDER — INSULIN ASPART 100 UNIT/ML IJ SOLN
0.0000 [IU] | Freq: Three times a day (TID) | INTRAMUSCULAR | Status: DC
  Administered 2021-07-09: 2 [IU] via SUBCUTANEOUS
  Administered 2021-07-10: 7 [IU] via SUBCUTANEOUS
  Administered 2021-07-10: 1 [IU] via SUBCUTANEOUS
  Administered 2021-07-10 – 2021-07-11 (×2): 3 [IU] via SUBCUTANEOUS
  Administered 2021-07-11: 5 [IU] via SUBCUTANEOUS
  Administered 2021-07-11 – 2021-07-12 (×2): 2 [IU] via SUBCUTANEOUS

## 2021-07-09 MED ORDER — SODIUM CHLORIDE 0.9% FLUSH
3.0000 mL | Freq: Two times a day (BID) | INTRAVENOUS | Status: DC
  Administered 2021-07-09 – 2021-07-11 (×4): 3 mL via INTRAVENOUS

## 2021-07-09 MED ORDER — CALCIUM GLUCONATE-NACL 2-0.675 GM/100ML-% IV SOLN
2.0000 g | Freq: Once | INTRAVENOUS | Status: AC
  Administered 2021-07-09: 2000 mg via INTRAVENOUS
  Filled 2021-07-09: qty 100

## 2021-07-09 MED ORDER — LIVING WELL WITH DIABETES BOOK
Freq: Once | Status: DC
  Filled 2021-07-09: qty 1

## 2021-07-09 MED ORDER — DEXTROSE 50 % IV SOLN: 0.0000 mL | INTRAVENOUS | Status: DC | PRN

## 2021-07-09 MED ORDER — VANCOMYCIN HCL 1500 MG/300ML IV SOLN
1500.0000 mg | Freq: Once | INTRAVENOUS | Status: AC
  Administered 2021-07-09: 1500 mg via INTRAVENOUS
  Filled 2021-07-09 (×2): qty 300

## 2021-07-09 MED ORDER — INSULIN STARTER KIT- PEN NEEDLES (ENGLISH)
1.0000 | Freq: Once | Status: DC
  Filled 2021-07-09: qty 1

## 2021-07-09 MED ORDER — ENOXAPARIN SODIUM 40 MG/0.4ML IJ SOSY
40.0000 mg | PREFILLED_SYRINGE | INTRAMUSCULAR | Status: DC
  Administered 2021-07-10 – 2021-07-11 (×2): 40 mg via SUBCUTANEOUS
  Filled 2021-07-09 (×2): qty 0.4

## 2021-07-09 MED ORDER — INSULIN GLARGINE-YFGN 100 UNIT/ML ~~LOC~~ SOLN
10.0000 [IU] | Freq: Every day | SUBCUTANEOUS | Status: DC
  Administered 2021-07-09 – 2021-07-11 (×3): 10 [IU] via SUBCUTANEOUS
  Filled 2021-07-09 (×4): qty 0.1

## 2021-07-09 NOTE — H&P (Addendum)
History and Physical    [name] XLK:[id number] DOB: Apr 21, 1981 DOA: 07/09/2021  Referring MD/NP/PA: Addison Lank, MD PCP: Mary Rosser, PA-C  Patient coming from: Home  Chief Complaint: Nausea and vomiting  I have personally briefly reviewed patient's old medical records in [place]   HPI: [name] is a 40 y.o. female with medical history significant of positive family history for diabetes and onychomycoses who presents with complaints of nausea and vomiting for 1 week.  She relates symptoms to being only person in her household who had eaten [employer] 9 days ago.  The following day she reported having stomach cramps, but thought it was secondary to her menstrual cycle starting.  The next day she began having nausea with violent vomiting.  Emesis was noted to be nonbloody and nonbilious in appearance.  At that time she was thinking that it was just food poisoning.  She tried drinking to water and Gatorade for the electrolytes, but was unable to keep any significant food or liquids down.  Her and her husband live here in [place], but had been trying to keep their primary care provider lives in [place] and she had just recently establish care with new practice.  Her husband adds that the patient has been having urinary frequency and polydipsia.  Associated symptoms include generalized malaise and abdominal soreness from vomiting. She denies having any significant fever, weight change, cough, shortness of breath, chest pain, dysuria, vaginal discharge, or diarrhea.  Patient's mother, maternal grandmother, and maternal great-grandmother had a history of diabetes, but the patient reports that she was never told that she was diabetic.  Review of records does note that labs from 2014 show patient's blood glucose to be elevated at 241 at that time.  ED Course: Upon admission into the emergency department patient was seen to be febrile up to 100.9 F, pulse 10 3-1 25, respirations  13-21, blood pressure 72/51-136/73, and O2 saturation maintained on room air.  Labs significant for WBC 16.7, platelets 550, sodium 122, potassium 2.9, chloride 75, CO2 23, BUN 21, creatinine 1.33, glucose 484, calcium 8.6, anion gap 24, lipase 24, total bilirubin 1.3.  Patient was given 2 L of normal saline IV fluids, potassium chloride 20 mEq IV, antiemetics, and started on insulin drip.  COVID-19 and and urinalysis were pending.  Review of Systems  Constitutional:  Positive for malaise/fatigue. Negative for fever.  HENT:  Negative for ear discharge and nosebleeds.   Eyes:  Negative for photophobia and pain.  Respiratory:  Negative for cough and shortness of breath.   Cardiovascular:  Negative for chest pain and leg swelling.  Gastrointestinal:  Positive for abdominal pain (Soreness), nausea and vomiting. Negative for blood in stool and diarrhea.  Genitourinary:  Positive for frequency. Negative for dysuria.  Musculoskeletal:  Negative for falls and joint pain.  Skin:  Negative for rash.  Neurological:  Positive for weakness. Negative for focal weakness.  Endo/Heme/Allergies:  Positive for polydipsia.   Past Medical History:  Diagnosis Date   Onychomycosis     History reviewed. No pertinent surgical history.   reports that she has never smoked. She has never used smokeless tobacco. She reports that she does not drink alcohol. No history on file for drug use.  No Known Allergies  Family History  Problem Relation Age of Onset   Diabetes Mother    Diabetes Maternal Grandmother     Prior to Admission medications   Medication Sig Start Date End Date Taking? Authorizing Provider  cyclobenzaprine (FLEXERIL) 5 MG tablet Take 1-2 tablets 3 times daily as needed Patient not taking: No sig reported 07/11/18   Laban Emperor, PA-C    Physical Exam:  Constitutional: Middle-aged female who appears to be sick, but able to follow commands Vitals:   07/09/21 0505 07/09/21 0515 07/09/21  0530 07/09/21 0600  BP: 130/80 136/73 111/82 123/76  Pulse: (!) 108 (!) 109 (!) 103 (!) 118  Resp: (!) '21 16 15 20  ' Temp:      TempSrc:      SpO2: 99% 99% 99% 99%   Eyes: PERRL, lids and conjunctivae normal ENMT: Mucous membranes are dry. Posterior pharynx clear of any exudate or lesions. Normal dentition.  Neck: normal, supple, no masses, no thyromegaly Respiratory: clear to auscultation bilaterally, no wheezing, no crackles. Normal respiratory effort. No accessory muscle use.  Cardiovascular: Tachycardic, no murmurs / rubs / gallops. No extremity edema. 2+ pedal pulses. No carotid bruits.  Abdomen: no tenderness, no masses palpated. No hepatosplenomegaly. Bowel sounds positive.  Musculoskeletal: no clubbing / cyanosis. No joint deformity upper and lower extremities. Good ROM, no contractures. Normal muscle tone.  Skin: no rashes, lesions, ulcers. No induration Neurologic: CN 2-12 grossly intact. Sensation intact, DTR normal. Strength 5/5 in all 4.  Psychiatric: Normal judgment and insight. Alert and oriented x 3. Normal mood.     Labs on Admission: I have personally reviewed following labs and imaging studies  CBC: Recent Labs  Lab 07/09/21 0224 07/09/21 0535  WBC 16.7*  --   NEUTROABS 11.1*  --   HGB 13.4 13.9  HCT 40.4 41.0  MCV 75.5*  --   PLT 550*  --    Basic Metabolic Panel: Recent Labs  Lab 07/09/21 0224 07/09/21 0535  NA 122* 124*  K 2.9* 2.8*  CL 75*  --   CO2 23  --   GLUCOSE 484*  --   BUN 21*  --   CREATININE 1.33*  --   CALCIUM 8.6*  --    GFR: CrCl cannot be calculated (Unknown ideal weight.). Liver Function Tests: Recent Labs  Lab 07/09/21 0224  AST 13*  ALT 13  ALKPHOS 63  BILITOT 1.3*  PROT 7.1  ALBUMIN 3.3*   Recent Labs  Lab 07/09/21 0224  LIPASE 24   No results for input(s): AMMONIA in the last 168 hours. Coagulation Profile: No results for input(s): INR, PROTIME in the last 168 hours. Cardiac Enzymes: No results for  input(s): CKTOTAL, CKMB, CKMBINDEX, TROPONINI in the last 168 hours. BNP (last 3 results) No results for input(s): PROBNP in the last 8760 hours. HbA1C: No results for input(s): HGBA1C in the last 72 hours. CBG: Recent Labs  Lab 07/09/21 0702  GLUCAP 363*   Lipid Profile: No results for input(s): CHOL, HDL, LDLCALC, TRIG, CHOLHDL, LDLDIRECT in the last 72 hours. Thyroid Function Tests: No results for input(s): TSH, T4TOTAL, FREET4, T3FREE, THYROIDAB in the last 72 hours. Anemia Panel: No results for input(s): VITAMINB12, FOLATE, FERRITIN, TIBC, IRON, RETICCTPCT in the last 72 hours. Urine analysis:    Component Value Date/Time   COLORURINE AMBER (A) 09/22/2013 0838   APPEARANCEUR CLOUDY (A) 09/22/2013 0838   LABSPEC >1.046 (H) 09/22/2013 0838   PHURINE 5.5 09/22/2013 0838   GLUCOSEU >1000 (A) 09/22/2013 0838   HGBUR LARGE (A) 09/22/2013 0838   BILIRUBINUR SMALL (A) 09/22/2013 0838   KETONESUR 40 (A) 09/22/2013 0838   PROTEINUR 30 (A) 09/22/2013 0838   UROBILINOGEN 1.0 09/22/2013 0838   NITRITE  NEGATIVE 09/22/2013 0838   LEUKOCYTESUR SMALL (A) 09/22/2013 0838   Sepsis Labs: No results found for this or any previous visit (from the past 240 hour(s)).   Radiological Exams on Admission: CT ABDOMEN PELVIS W CONTRAST  Result Date: 07/09/2021 CLINICAL DATA:  Abdominal pain and fever EXAM: CT ABDOMEN AND PELVIS WITH CONTRAST TECHNIQUE: Multidetector CT imaging of the abdomen and pelvis was performed using the standard protocol following bolus administration of intravenous contrast. CONTRAST:  122m OMNIPAQUE IOHEXOL 350 MG/ML SOLN COMPARISON:  None. FINDINGS: Lower chest: No acute abnormality. Hepatobiliary: No focal liver abnormality is seen. Gallbladder appears normal. No bile duct dilatation is seen. Pancreas: Unremarkable. No pancreatic ductal dilatation or surrounding inflammatory changes. Spleen: Normal in size without focal abnormality. Adrenals/Urinary Tract: Kidneys appear  normal without stone or hydronephrosis. No perinephric fluid. Adrenal glands appear normal. No ureteral or bladder calculi are identified. Bladder appears normal, partially decompressed. Stomach/Bowel: No dilated large or small bowel loops. No evidence of bowel wall inflammation. Appendix is normal. Stomach is unremarkable, partially decompressed. Vascular/Lymphatic: No vascular abnormality is identified. No enlarged lymph nodes are seen within the abdomen or pelvis. Reproductive: Uterine fibroid exophytic to the posterior fundus, measuring 3 cm. Adnexal regions are unremarkable without mass or free fluid. Other: No free fluid or abscess collection. No free intraperitoneal air. Musculoskeletal: No acute or significant osseous findings. IMPRESSION: 1. No acute findings within the abdomen or pelvis. No source for abdominal pain identified. No bowel obstruction or evidence of bowel wall inflammation. No evidence of acute solid organ abnormality. No renal or ureteral calculi. Appendix is normal. 2. Uterine fibroid, measuring 3 cm. Electronically Signed   By: SFranki CabotM.D.   On: 07/09/2021 06:13    Chest x-ray: Independently reviewed.  Normal chest with no signs of infiltrate appreciated  Assessment/Plan SIRS/suspected sepsis: Acute.  Patient reports 1 week with complaints of nausea and vomiting.  On admission patient noted to be febrile up to 100.9 F, tachycardic, tachypneic, and white blood cell count was elevated at 16.7.  Patient possibility of a viral gastroenteritis, but has symptoms lasted 1 week and noted fever question the possibility of bacterial infection. -Admit to a progressive bed -Check blood cultures -Check ESR, CRP, RPR, TSH -Follow-up lactic acid and trend -Follow-up urinalysis -Check chest x-ray -Empiric antibiotics vancomycin and cefepime -Acetaminophen as needed for fever  Hyperglycemia with elevated anion gap: Admission labs significant for sugar 448, CO2 23, anion gap 24, beta  hydroxybutyrate acid 7.02, and venous pH was 7.5.  Urinalysis was still pending initially and patient did not appear to meet criteria for DKA or HHS.  Patient has been bolused 2 L of normal saline IV fluids and placed on insulin drip. -Focus hyperglycemic orderset utilized -Follow-up urinalysis -Check hemoglobin [id number]-Check salicylate level and acetaminophen level given elevated anion gap -Check BMP every 4 hours x2 -Insulin drip and normal saline IV fluids at 125 mL/h -Plan to transition to subcu insulin -Diabetic education consulted  Transient hypotension: Resolved.  On admission patient's MAP were noted to be as low as 57.  After initial fluid boluses blood pressures improved with  MAP greater than 65. -Continue IV fluids to maintain maps greater than 65.  Nausea and vomiting: Acute.  In addition to nausea and vomiting patient has complained of abdominal pain.  Lipase was within normal limits.  CT scan of the abdomen pelvis however negative for any acute findings to explain patient's symptoms.  Suspect likely related to suspected gastroenteritis vs. diabetes  with hyperglycemia. -N.p.o. and will advance diet as tolerated after able to wean off insulin drip -Antiemetics as needed   Hypokalemia Hypocalcemia Hyponatremia hypochloremia: Acute.  Initial labs noted sodium 122, potassium 2.9, chloride 75, and calcium 8.6 with ionized calcium 0.95.  Sodium corrected for hyperglycemia was noted to be 130.  Patient had received 2 L of normal saline IV fluids and placed on a rate of 125 mL/h and given 20 mEq of potassium chloride.   -Check magnesium -Give 40 mEq of potassium chloride IV -Give 1 g of calcium gluconate -Continue to monitor electrolytes and replace as needed  Acute kidney injury: Upon admission to the emergency department patient was noted to have creatinine 1.33 and BUN 21.  Last available creatinine noted to be 0.51 in 2014 without recent.  CT scan did not note any acute abnormalities  in the kidneys or bladder.  Suspect secondary to dehydration given reports of nausea and vomiting and decreased p.o. intake. -IV fluids as seen above -Continue to monitor kidney function  Hyperbilirubinemia: Acute.  Total bilirubin was just mildly elevated at 1.3.  This is likely related to above as no acute abnormalities were noted on CT.  Thrombocytosis: Acute.  Platelet count elevated at 550.  Suspect reactive in nature. -Repeat CBC tomorrow morning  DVT prophylaxis: Lovenox Code Status: Full Family Communication: Husband updated at bedside Disposition Plan: Likely home once medically stable Consults called: Diabetic education Admission status: Inpatient, require more than 2 midnight stay due to need of IV antibiotic  Norval Morton MD [facility]   If 7PM-7AM, please contact night-coverage   07/09/2021, 7:10 AM

## 2021-07-09 NOTE — ED Provider Notes (Signed)
[facility] EMERGENCY DEPARTMENT Provider Note  CSN: [id number] Arrival date & time: 07/09/21 0132  Chief Complaint(s) Emesis  HPI [name] is a 40 y.o. female with no pertinent past medical history who presents to the emergency department for 1 week of nausea and nonbloody nonbilious emesis..  Patient is endorsing associated upper abdominal discomfort due to emesis.  No known fevers or chills.  No known sick contacts.  Reports possible suspicious food intake stating that she ate [employer] the day this began.  She denies any recent travels.  No urinary symptoms.  Reports that she has been trying to hydrate with coconut water, Pedialyte, lemonade, and sweet tea.  She has been able to keep some fluids down.  However she is unable to keep food down.  The history is provided by the patient.   Past Medical History Past Medical History:  Diagnosis Date   Onychomycosis    Patient Active Problem List   Diagnosis Date Noted   SIRS (systemic inflammatory response syndrome) ([place]) 07/09/2021   Home Medication(s) Prior to Admission medications   Not on File                                                                                                                                    Past Surgical History History reviewed. No pertinent surgical history. Family History No family history on file.  Social History Social History   Tobacco Use   Smoking status: Never   Smokeless tobacco: Never  Substance Use Topics   Alcohol use: No   Allergies Patient has no known allergies.  Review of Systems Review of Systems All other systems are reviewed and are negative for acute change except as noted in the HPI  Physical Exam Vital Signs  I have reviewed the triage vital signs BP 123/76   Pulse (!) 118   Temp (!) 100.9 F (38.3 C) (Oral)   Resp 20   Ht 5\' 5"  (1.651 m)   Wt 77.7 kg   SpO2 99%   BMI 28.51 kg/m   Physical Exam Vitals reviewed.   Constitutional:      General: She is not in acute distress.    Appearance: She is well-developed. She is not diaphoretic.  HENT:     Head: Normocephalic and atraumatic.     Nose: Nose normal.  Eyes:     General: No scleral icterus.       Right eye: No discharge.        Left eye: No discharge.     Conjunctiva/sclera: Conjunctivae normal.     Pupils: Pupils are equal, round, and reactive to light.  Cardiovascular:     Rate and Rhythm: Regular rhythm. Tachycardia present.     Heart sounds: No murmur heard.   No friction rub. No gallop.  Pulmonary:     Effort: Pulmonary effort is normal. No respiratory distress.     Breath sounds:  Normal breath sounds. No stridor. No rales.  Abdominal:     General: There is no distension.     Palpations: Abdomen is soft.     Tenderness: There is abdominal tenderness (mild) in the right upper quadrant, epigastric area and left upper quadrant. There is no guarding or rebound. Negative signs include Murphy's sign.  Musculoskeletal:        General: No tenderness.     Cervical back: Normal range of motion and neck supple.  Skin:    General: Skin is warm and dry.     Findings: No erythema or rash.  Neurological:     Mental Status: She is alert and oriented to person, place, and time.    ED Results and Treatments Labs (all labs ordered are listed, but only abnormal results are displayed) Labs Reviewed  CBC WITH DIFFERENTIAL/PLATELET - Abnormal; Notable for the following components:      Result Value   WBC 16.7 (*)    RBC 5.35 (*)    MCV 75.5 (*)    MCH 25.0 (*)    Platelets 550 (*)    Neutro Abs 11.1 (*)    Monocytes Absolute 1.4 (*)    Abs Immature Granulocytes 0.13 (*)    All other components within normal limits  COMPREHENSIVE METABOLIC PANEL - Abnormal; Notable for the following components:   Sodium 122 (*)    Potassium 2.9 (*)    Chloride 75 (*)    Glucose, Bld 484 (*)    BUN 21 (*)    Creatinine, Ser 1.33 (*)    Calcium 8.6 (*)     Albumin 3.3 (*)    AST 13 (*)    Total Bilirubin 1.3 (*)    GFR, Estimated 52 (*)    Anion gap 24 (*)    All other components within normal limits  BETA-HYDROXYBUTYRIC ACID - Abnormal; Notable for the following components:   Beta-Hydroxybutyric Acid 7.02 (*)    All other components within normal limits  I-STAT VENOUS BLOOD GAS, ED - Abnormal; Notable for the following components:   pH, Ven 7.500 (*)    pCO2, Ven 33.9 (*)    pO2, Ven 52.0 (*)    Acid-Base Excess 4.0 (*)    Sodium 124 (*)    Potassium 2.8 (*)    Calcium, Ion 0.95 (*)    All other components within normal limits  CBG MONITORING, ED - Abnormal; Notable for the following components:   Glucose-Capillary 363 (*)    All other components within normal limits  RESP PANEL BY RT-PCR (FLU A&B, COVID) ARPGX2  CULTURE, BLOOD (ROUTINE X 2)  CULTURE, BLOOD (ROUTINE X 2)  LIPASE, BLOOD  URINALYSIS, ROUTINE W REFLEX MICROSCOPIC  LACTIC ACID, PLASMA  HIV ANTIBODY (ROUTINE TESTING W REFLEX)  PROCALCITONIN  BASIC METABOLIC PANEL  BASIC METABOLIC PANEL  HEMOGLOBIN A1C  OSMOLALITY  MAGNESIUM  TSH  SALICYLATE LEVEL  ACETAMINOPHEN LEVEL  C-REACTIVE PROTEIN  RPR  SEDIMENTATION RATE  I-STAT BETA HCG BLOOD, ED ([facility], [facility], [facility] ONLY)  I-STAT BETA HCG BLOOD, ED ([facility], [facility], [facility] ONLY)  EKG   EKG Interpretation  Date/Time:  Sunday July 09 2021 17:00:53 EDT Ventricular Rate:  106 PR Interval:  162 QRS Duration: 75 QT Interval:  372 QTC Calculation: 494 R Axis:   72 Text Interpretation: Sinus tachycardia Anterior infarct, age indeterminate Confirmed by Addison Lank [phone]) on 07/09/2021 10:58:34 PM       Radiology CT ABDOMEN PELVIS W CONTRAST  Result Date: 07/09/2021 CLINICAL DATA:  Abdominal pain and fever EXAM: CT ABDOMEN AND PELVIS WITH CONTRAST TECHNIQUE: Multidetector CT imaging of the abdomen and pelvis  was performed using the standard protocol following bolus administration of intravenous contrast. CONTRAST:  125mL OMNIPAQUE IOHEXOL 350 MG/ML SOLN COMPARISON:  None. FINDINGS: Lower chest: No acute abnormality. Hepatobiliary: No focal liver abnormality is seen. Gallbladder appears normal. No bile duct dilatation is seen. Pancreas: Unremarkable. No pancreatic ductal dilatation or surrounding inflammatory changes. Spleen: Normal in size without focal abnormality. Adrenals/Urinary Tract: Kidneys appear normal without stone or hydronephrosis. No perinephric fluid. Adrenal glands appear normal. No ureteral or bladder calculi are identified. Bladder appears normal, partially decompressed. Stomach/Bowel: No dilated large or small bowel loops. No evidence of bowel wall inflammation. Appendix is normal. Stomach is unremarkable, partially decompressed. Vascular/Lymphatic: No vascular abnormality is identified. No enlarged lymph nodes are seen within the abdomen or pelvis. Reproductive: Uterine fibroid exophytic to the posterior fundus, measuring 3 cm. Adnexal regions are unremarkable without mass or free fluid. Other: No free fluid or abscess collection. No free intraperitoneal air. Musculoskeletal: No acute or significant osseous findings. IMPRESSION: 1. No acute findings within the abdomen or pelvis. No source for abdominal pain identified. No bowel obstruction or evidence of bowel wall inflammation. No evidence of acute solid organ abnormality. No renal or ureteral calculi. Appendix is normal. 2. Uterine fibroid, measuring 3 cm. Electronically Signed   By: Franki Cabot M.D.   On: 07/09/2021 06:13   Portable chest 1 View  Result Date: 07/09/2021 CLINICAL DATA:  Nausea and vomiting.  Weakness. EXAM: PORTABLE CHEST 1 VIEW COMPARISON:  None. FINDINGS: Heart size and mediastinal contours are within normal limits. Lungs are clear. No pleural effusion or pneumothorax is seen. Osseous structures about the chest are  unremarkable. IMPRESSION: No active disease. No evidence of pneumonia or pulmonary edema. Electronically Signed   By: Franki Cabot M.D.   On: 07/09/2021 08:00    Pertinent labs & imaging results that were available during my care of the patient were reviewed by me and considered in my medical decision making (see MDM for details).  Medications Ordered in ED Medications  insulin regular, human (MYXREDLIN) 100 units/ 100 mL infusion (5.5 Units/hr Intravenous New Bag/Given 07/09/21 0716)  dextrose 5 % in lactated ringers infusion (has no administration in time range)  dextrose 50 % solution 0-50 mL (has no administration in time range)  0.9 %  sodium chloride infusion (has no administration in time range)  potassium chloride 10 mEq in 100 mL IVPB (has no administration in time range)  calcium gluconate 1 g/ 50 mL sodium chloride IVPB (has no administration in time range)  enoxaparin (LOVENOX) injection 40 mg (has no administration in time range)  sodium chloride flush (NS) 0.9 % injection 3 mL (has no administration in time range)  acetaminophen (TYLENOL) tablet 650 mg (has no administration in time range)    Or  acetaminophen (TYLENOL) suppository 650 mg (has no administration in time range)  ondansetron (ZOFRAN) tablet 4 mg (has no administration in time range)    Or  ondansetron (  ZOFRAN) injection 4 mg (has no administration in time range)  albuterol (PROVENTIL) (2.5 MG/3ML) 0.083% nebulizer solution 2.5 mg (has no administration in time range)  ceFEPIme (MAXIPIME) 2 g in sodium chloride 0.9 % 100 mL IVPB (has no administration in time range)  vancomycin (VANCOCIN) IVPB 1000 mg/200 mL premix (has no administration in time range)  ondansetron (ZOFRAN-ODT) disintegrating tablet 4 mg (4 mg Oral Given 07/09/21 0212)  sodium chloride 0.9 % bolus 1,000 mL (1,000 mLs Intravenous New Bag/Given 07/09/21 0604)    Followed by  sodium chloride 0.9 % bolus 1,000 mL (1,000 mLs Intravenous New Bag/Given  07/09/21 0702)  potassium chloride 10 mEq in 100 mL IVPB (10 mEq Intravenous New Bag/Given 07/09/21 0703)  prochlorperazine (COMPAZINE) injection 10 mg (10 mg Intravenous Given 07/09/21 0551)  iohexol (OMNIPAQUE) 350 MG/ML injection 100 mL (100 mLs Intravenous Contrast Given 07/09/21 0555)                                                                                                                                     Procedures .1-3 Lead EKG Interpretation Performed by: Fatima Blank, MD Authorized by: Fatima Blank, MD     Interpretation: abnormal     ECG rate:  115   ECG rate assessment: tachycardic     Rhythm: sinus tachycardia     Ectopy: none     Conduction: normal   .Critical Care Performed by: Fatima Blank, MD Authorized by: Fatima Blank, MD   Critical care provider statement:    Critical care time (minutes):  45   Critical care time was exclusive of:  Separately billable procedures and treating other patients   Critical care was necessary to treat or prevent imminent or life-threatening deterioration of the following conditions:  Renal failure, dehydration and metabolic crisis   Critical care was time spent personally by me on the following activities:  Development of treatment plan with patient or surrogate, discussions with consultants, evaluation of patient's response to treatment, examination of patient, obtaining history from patient or surrogate, review of old charts, re-evaluation of patient's condition, pulse oximetry, ordering and review of radiographic studies, ordering and review of laboratory studies and ordering and performing treatments and interventions   Care discussed with: admitting provider    (including critical care time)  Medical Decision Making / ED Course I have reviewed the nursing notes for this encounter and the patient's prior records (if available in EHR or on provided paperwork).  Makaylen Thieme was evaluated  in Emergency Department on 07/09/2021 for the symptoms described in the history of present illness. She was evaluated in the context of the global COVID-19 pandemic, which necessitated consideration that the patient might be at risk for infection with the SARS-CoV-2 virus that causes COVID-19. Institutional protocols and algorithms that pertain to the evaluation of patients at risk for COVID-19 are in a state of rapid change based on information  released by regulatory bodies including the CDC and federal and state organizations. These policies and algorithms were followed during the patient's care in the ED.     1 week of nausea vomiting. Upper abdominal discomfort. Mild discomfort to palpation in the upper quadrants.  Pertinent labs & imaging results that were available during my care of the patient were reviewed by me and considered in my medical decision making:  Notable for leukocytosis. No anemia. Noted to have hyperglycemia with evidence of anion gap.  Bicarb within normal limits.  Not consistent with DKA. Possible dehydration. Also noted to have AKI. Electrolyte derangements due to GI losses and hyperglycemia. No evidence of biliary obstruction or pancreatitis. CT of the abdomen obtained to rule out intra-abdominal inflammatory/infectious process but negative.  No evidence of bowel obstruction.  Still need urine to assess for infection.  If negative possible viral infection. Patient provided with IV fluids. Started on insulin drip. Repleted potassium.  Discussed case with hospitalist for admission, continued work-up and management. Antibiotic started by hospitalist.  Final Clinical Impression(s) / ED Diagnoses Final diagnoses:  Uncontrollable vomiting  Hypokalemia  Hyperglycemia  AKI (acute kidney injury) ([place])  Dehydration     This chart was dictated using voice recognition software.  Despite best efforts to proofread,  errors can occur which can change the documentation  meaning.    Fatima Blank, MD 07/09/21 2259

## 2021-07-09 NOTE — ED Triage Notes (Signed)
Pt states she had a virus last week. States has been unable to keep food down.

## 2021-07-09 NOTE — Progress Notes (Signed)
Pt was feeling hungry when she first came up but became nauseous due tot he movement of switching beds. Meds administered.

## 2021-07-09 NOTE — Progress Notes (Signed)
Pharmacy Antibiotic Note  [name] is a 40 y.o. female admitted on 07/09/2021 with sepsis - unknown source.  Pharmacy has been consulted for vancomycin/cefepime dosing. ?AKI - SCr 1.33 on admit (unclear baseline, last value documented 0.51 from 09/22/2013).  1x doses of cefepime/vancomycin already ordered in the ER.  Plan: Cefepime 2g IV x 1; then 2g IV q12h Vancomycin 1500mg  IV x 1; then 1250mg  IV q24h. Goal AUC 400-550. Expected AUC: 482 SCr used: 1.33 Monitor clinical progress, c/s, renal function F/u de-escalation plan/LOT, vancomycin levels as indicated   Height: 5\' 5"  (165.1 cm) Weight: 77.7 kg (171 lb 4.8 oz) IBW/kg (Calculated) : 57  Temp (24hrs), Avg:99.3 F (37.4 C), Min:97.7 F (36.5 C), Max:100.9 F (38.3 C)  Recent Labs  Lab 07/09/21 0224  WBC 16.7*  CREATININE 1.33*    Estimated Creatinine Clearance: 58 mL/min (A) (by C-G formula based on SCr of 1.33 mg/dL (H)).    No Known Allergies  Antimicrobials this admission: 10/16 vancomycin >>  10/16 cefepime >>   Dose adjustments this admission:   Microbiology results:   Arturo Morton, PharmD, BCPS Please check AMION for all [facility] contact numbers Clinical Pharmacist 07/09/2021 8:33 AM

## 2021-07-09 NOTE — ED Provider Notes (Addendum)
Emergency Medicine Provider Triage Evaluation Note  [name] , a 40 y.o. female  was evaluated in triage.  Pt complains of nausea/vomiting.  States stomach virus last week with continued nausea/vomiting.  No diarrhea.  States now she feels weak and lightheaded as if she is dehydrated.  Has only kept down small sips of juice.  Review of Systems  Positive: Nausea, vomiting Negative: fever  Physical Exam  BP (!) 114/94 (BP Location: Right Arm)   Pulse (!) 124   Temp 97.7 F (36.5 C) (Oral)   Resp 20   SpO2 96%   Gen:   Awake, no distress   Resp:  Normal effort  MSK:   Moves extremities without difficulty  Other:    Medical Decision Making  Medically screening exam initiated at 1:55 AM.  Appropriate orders placed.  Howie Ill was informed that the remainder of the evaluation will be completed by another provider, this initial triage assessment does not replace that evaluation, and the importance of remaining in the ED until their evaluation is complete.  Stomach virus, persistent nausea/vomiting.  Labs, UA.  Given zofran.   Larene Pickett, PA-C 07/09/21 0206    Larene Pickett, PA-C 07/09/21 1017    Fatima Blank, MD 07/09/21 [phone]

## 2021-07-10 DIAGNOSIS — R6510 Systemic inflammatory response syndrome (SIRS) of non-infectious origin without acute organ dysfunction: Principal | ICD-10-CM

## 2021-07-10 LAB — GLUCOSE, CAPILLARY
Glucose-Capillary: 129 mg/dL — ABNORMAL HIGH (ref 70–99)
Glucose-Capillary: 174 mg/dL — ABNORMAL HIGH (ref 70–99)
Glucose-Capillary: 188 mg/dL — ABNORMAL HIGH (ref 70–99)
Glucose-Capillary: 214 mg/dL — ABNORMAL HIGH (ref 70–99)
Glucose-Capillary: 305 mg/dL — ABNORMAL HIGH (ref 70–99)

## 2021-07-10 LAB — URINALYSIS, ROUTINE W REFLEX MICROSCOPIC
Bilirubin Urine: NEGATIVE
Glucose, UA: 150 mg/dL — AB
Ketones, ur: 80 mg/dL — AB
Nitrite: NEGATIVE
Protein, ur: 30 mg/dL — AB
Specific Gravity, Urine: 1.018 (ref 1.005–1.030)
pH: 6 (ref 5.0–8.0)

## 2021-07-10 LAB — BASIC METABOLIC PANEL
Anion gap: 13 (ref 5–15)
BUN: 10 mg/dL (ref 6–20)
CO2: 24 mmol/L (ref 22–32)
Calcium: 8.3 mg/dL — ABNORMAL LOW (ref 8.9–10.3)
Chloride: 95 mmol/L — ABNORMAL LOW (ref 98–111)
Creatinine, Ser: 0.8 mg/dL (ref 0.44–1.00)
GFR, Estimated: >60 mL/min (ref >60)
Glucose, Bld: 203 mg/dL — ABNORMAL HIGH (ref 70–99)
Potassium: 2.7 mmol/L — CL (ref 3.5–5.1)
Sodium: 132 mmol/L — ABNORMAL LOW (ref 135–145)

## 2021-07-10 LAB — RPR: RPR Ser Ql: NONREACTIVE

## 2021-07-10 LAB — POTASSIUM: Potassium: 3.4 mmol/L — ABNORMAL LOW (ref 3.5–5.1)

## 2021-07-10 MED ORDER — VANCOMYCIN HCL IN DEXTROSE 1-5 GM/200ML-% IV SOLN
1000.0000 mg | Freq: Two times a day (BID) | INTRAVENOUS | Status: DC
  Administered 2021-07-10 – 2021-07-11 (×2): 1000 mg via INTRAVENOUS
  Filled 2021-07-10 (×3): qty 200

## 2021-07-10 MED ORDER — GLUCERNA SHAKE PO LIQD
237.0000 mL | Freq: Three times a day (TID) | ORAL | Status: DC
Start: 2021-07-10 — End: 2021-07-12

## 2021-07-10 MED ORDER — MORPHINE SULFATE (PF) 2 MG/ML IV SOLN: 1.0000 mg | INTRAVENOUS | Status: DC | PRN

## 2021-07-10 MED ORDER — SODIUM CHLORIDE 0.9 % IV SOLN: INTRAVENOUS | Status: AC

## 2021-07-10 MED ORDER — POTASSIUM CHLORIDE CRYS ER 20 MEQ PO TBCR
40.0000 meq | EXTENDED_RELEASE_TABLET | ORAL | Status: DC
  Administered 2021-07-10: 40 meq via ORAL
  Filled 2021-07-10: qty 2

## 2021-07-10 MED ORDER — PANTOPRAZOLE SODIUM 40 MG PO TBEC
40.0000 mg | DELAYED_RELEASE_TABLET | Freq: Every day | ORAL | Status: DC
  Administered 2021-07-10 – 2021-07-11 (×2): 40 mg via ORAL
  Filled 2021-07-10 (×2): qty 1

## 2021-07-10 MED ORDER — MAGNESIUM HYDROXIDE 400 MG/5ML PO SUSP
30.0000 mL | Freq: Every day | ORAL | Status: DC | PRN
  Administered 2021-07-10 – 2021-07-12 (×4): 30 mL via ORAL
  Filled 2021-07-10 (×5): qty 30

## 2021-07-10 MED ORDER — POTASSIUM CHLORIDE 10 MEQ/100ML IV SOLN
10.0000 meq | INTRAVENOUS | Status: AC
  Administered 2021-07-10 (×3): 10 meq via INTRAVENOUS
  Filled 2021-07-10 (×4): qty 100

## 2021-07-10 MED ORDER — ADULT MULTIVITAMIN W/MINERALS CH
1.0000 | ORAL_TABLET | Freq: Every day | ORAL | Status: DC
  Administered 2021-07-10 – 2021-07-11 (×2): 1 via ORAL
  Filled 2021-07-10 (×2): qty 1

## 2021-07-10 MED ORDER — SODIUM CHLORIDE 0.9 % IV SOLN
2.0000 g | Freq: Three times a day (TID) | INTRAVENOUS | Status: DC
  Administered 2021-07-10 – 2021-07-11 (×3): 2 g via INTRAVENOUS
  Filled 2021-07-10 (×3): qty 2

## 2021-07-10 MED ORDER — PROCHLORPERAZINE EDISYLATE 10 MG/2ML IJ SOLN
5.0000 mg | Freq: Once | INTRAMUSCULAR | Status: AC | PRN
  Administered 2021-07-10: 5 mg via INTRAVENOUS
  Filled 2021-07-10: qty 2

## 2021-07-10 MED ORDER — PROCHLORPERAZINE EDISYLATE 10 MG/2ML IJ SOLN
10.0000 mg | INTRAMUSCULAR | Status: DC | PRN
  Administered 2021-07-10: 10 mg via INTRAVENOUS
  Filled 2021-07-10: qty 2

## 2021-07-10 NOTE — Progress Notes (Signed)
Pharmacy Antibiotic Note  [name] is a 40 y.o. female admitted on 07/09/2021 with sepsis - unknown source.  Pharmacy has been consulted for vancomycin/cefepime dosing.  AKI - SCr 1.33 on admit (unclear baseline, last value documented 0.51 from 09/22/2013).  Recent Scr 0.8  Plan: Cefepime 2g IV q8h Vancomycin 1000mg  IV q12h. Goal AUC 400-550. Expected AUC: 493 SCr used: 0.8 Monitor clinical progress, c/s, renal function F/u de-escalation plan/LOT, vancomycin levels as indicated   Height: 5\' 5"  (165.1 cm) Weight: 75.9 kg (167 lb 5.3 oz) (new bed) IBW/kg (Calculated) : 57  Temp (24hrs), Avg:99 F (37.2 C), Min:98.7 F (37.1 C), Max:99.3 F (37.4 C)  Recent Labs  Lab 07/09/21 0224 07/09/21 0900 07/09/21 0928 07/09/21 1314 07/10/21 0517  WBC 16.7*  --   --   --   --   CREATININE 1.33* 0.98  --  0.92 0.80  LATICACIDVEN  --   --  1.9  --   --      Estimated Creatinine Clearance: 95.3 mL/min (by C-G formula based on SCr of 0.8 mg/dL).    No Known Allergies  Antimicrobials this admission: 10/16 vancomycin >>  10/16 cefepime >>   Dose adjustments this admission: 10/17 vancomycin 1250mg  IV q24h >> 1000mg  IV q12h 10/17 cefepime 2g IV q12h >> 2g IV q8h  Microbiology results: Bcx negative to date Pending Ucx  Chrisandra Carota, PharmD Candidate 07/10/2021 1:58 PM

## 2021-07-10 NOTE — Progress Notes (Signed)
Inpatient Diabetes Program Recommendations  AACE/ADA: New Consensus Statement on Inpatient Glycemic Control (2015)  Target Ranges:  Prepandial:   less than 140 mg/dL      Peak postprandial:   less than 180 mg/dL (1-2 hours)      Critically ill patients:  140 - 180 mg/dL   Lab Results  Component Value Date   GLUCAP 188 (H) 07/10/2021   HGBA1C 12.9 (H) 07/09/2021    Review of Glycemic Control Results for [name], [name] (MRN [id number]) as of 07/10/2021 14:04  Ref. Range 07/09/2021 16:18 07/09/2021 17:17 07/10/2021 00:15 07/10/2021 08:16 07/10/2021 11:20  Glucose-Capillary Latest Ref Range: 70 - 99 mg/dL 186 (H) 161 (H) 174 (H) 214 (H) 188 (H)   Diabetes history: No prior hx Current orders for Inpatient glycemic control: Semglee 10 units q 15 + Novolog 0-9 units correction tid  Inpatient Diabetes Program Recommendations:   Spoke with patient @ bedside to introduce myself as diabetes coordinator. Patient states she does not feel well enough to discuss insulin and diabetes and wants to wait until tomorrow.  Thank you, Nani Gasser. Jermine Bibbee, RN, MSN, CDE  Diabetes Coordinator Inpatient Glycemic Control Team Team Pager [phone] (8am-5pm) 07/10/2021 2:06 PM

## 2021-07-10 NOTE — Discharge Instructions (Signed)

## 2021-07-10 NOTE — Progress Notes (Signed)
Initial Nutrition Assessment  DOCUMENTATION CODES:   Not applicable  INTERVENTION:  -Glucerna Shake po TID, each supplement provides 220 kcal and 10 grams of protein -MVI with minerals daily -DM education, RD to follow-up tomorrow  NUTRITION DIAGNOSIS:   Inadequate oral intake related to nausea as evidenced by per patient/family report, meal completion < 50%.  GOAL:   Patient will meet greater than or equal to 90% of their needs  MONITOR:   PO intake, Supplement acceptance, Labs, Weight trends, I & O's  REASON FOR ASSESSMENT:   Consult Diet education  ASSESSMENT:   Pt with PMH significant for onychomycosis admitted with SIRS/suspected sepsis and hyperglycemia with elevated anioin gap (did not meet criteria for DKA or HHS)  Pt unavailable at time of RD visit. Per H&P, pt reports 1 week h/o N/V with inability to tolerate POs. Pt also with urinary frequency and polydipsia. Pt's A1c was obtained and found to be >12. Pt has not been told that she has DM in the past, but does report strong family h/o DM. RD will attach education to discharge summary and will re-attempt education tomorrow.   Reviewed weight history. No significant weight changes observed.   PO Intake: 0-25% x 2 recorded meals   Medications: SSI TID w/ meals, 10 units semglee daily, protonix, IV abx, IV Kcl 52mEq x3 Labs: Na 132 (L), K+ 3.4 (L) CBGs: [id number] hours  NUTRITION - FOCUSED PHYSICAL EXAM: Unable to perform at this time. Will attempt at follow-up.  Diet Order:   Diet Order             Diet Carb Modified Fluid consistency: Thin; Room service appropriate? Yes  Diet effective now                   EDUCATION NEEDS:   Education needs have been addressed  Skin:  Skin Assessment: Reviewed RN Assessment  Last BM:  10/13  Height:   Ht Readings from Last 1 Encounters:  07/09/21 5\' 5"  (1.651 m)    Weight:   Wt Readings from Last 10 Encounters:  07/10/21 75.9 kg  07/11/18 73.9 kg    BMI:  Body mass index is 27.85 kg/m.  Estimated Nutritional Needs:   Kcal:  1900-2100  Protein:  95-105 grams  Fluid:  >1.9L/d    Larkin Ina, MS, RD, LDN (she/her/hers) RD pager number and weekend/on-call pager number located in [place].

## 2021-07-10 NOTE — Progress Notes (Signed)
Progress Note    [name]  BZJ:[id number] DOB: 12-Nov-1980  DOA: 07/09/2021 PCP: Shanon Rosser, PA-C    Brief Narrative:     Medical records reviewed and are as summarized below:  [name] is an 40 y.o. female with medical history significant of positive family history for diabetes and onychomycoses who presents with complaints of nausea and vomiting for 1 week.  HgbA1c elevated to >12.  Never been told she has DM in past.    Assessment/Plan:   Principal Problem:   SIRS (systemic inflammatory response syndrome) (HCC) Active Problems:   Nausea and vomiting   Hypokalemia   Hypocalcemia   Hypochloremia   Hyponatremia   AKI (acute kidney injury) ([place])   Thrombocytosis   Hyperbilirubinemia   SIRS/suspected sepsis: Acute.  Patient reports 1 week with complaints of nausea and vomiting.  On admission patient noted to be febrile up to 100.9 F, tachycardic, tachypneic, and white blood cell count was elevated at 16.7.  Patient possibility of a viral gastroenteritis, but has symptoms lasted 1 week and noted fever question the possibility of bacterial infection. -Check blood cultures -urinalysis pending -Check chest x-ray -Empiric antibiotics vancomycin and cefepime -Acetaminophen as needed for fever   New diagnosis of DM -Focus hyperglycemic orderset utilized -await urinalysis - hemoglobin A1c >12 -subcu insulin plus SSI -Diabetic education consulted   Transient hypotension: Resolved.  On admission patient's MAP were noted to be as low as 57.  After initial fluid boluses blood pressures improved with  MAP greater than 65.   Nausea and vomiting: Acute.  In addition to nausea and vomiting patient has complained of abdominal pain.  Lipase was within normal limits.  CT scan of the abdomen pelvis however negative for any acute findings to explain patient's symptoms.  Suspect likely related to suspected gastroenteritis vs. diabetes with hyperglycemia. -protonix/MOM -PRN morphine  for refractory pain   Hypokalemia Hypocalcemia Hyponatremia hypochloremia: Acute.  Initial labs noted sodium 122, potassium 2.9, chloride 75, and calcium 8.6 with ionized calcium 0.95.  Sodium corrected for hyperglycemia was noted to be 130.  Patient had received 2 L of normal saline IV fluids and placed on a rate of 125 mL/h and given 20 mEq of potassium chloride.   -repleted   Acute kidney injury:  -resolved with IVF   Hyperbilirubinemia: Acute.  Total bilirubin was just mildly elevated at 1.3.  This is likely related to above as no acute abnormalities were noted on CT.   Thrombocytosis: Acute.  Platelet count elevated at 550.  Suspect reactive in nature. -Repeat CBC tomorrow morning      Family Communication/Anticipated D/C date and plan/Code Status   DVT prophylaxis: Lovenox ordered. Code Status: Full Code.  Family Communication: at bedside (both will need notes for work) Disposition Plan: Status is: Inpatient           Medical Consultants:   None.    Subjective:   C/o epigastric pain  Objective:    Vitals:   07/10/21 0016 07/10/21 0441 07/10/21 0621 07/10/21 0759  BP: 108/61 130/64  140/80  Pulse: 99 (!) 106  97  Resp:    18  Temp:  98.8 F (37.1 C)  98.7 F (37.1 C)  TempSrc:  Oral  Oral  SpO2:  97%  99%  Weight:   75.9 kg   Height:        Intake/Output Summary (Last 24 hours) at 07/10/2021 1124 Last data filed at 07/10/2021 0800 Gross per 24 hour  Intake  656.11 ml  Output --  Net 656.11 ml   Filed Weights   07/09/21 0700 07/10/21 0459  Weight: 77.7 kg 75.9 kg    Exam:  General: Appearance:     Overweight female who appears uncomfortable     Lungs:     respirations unlabored  Heart:    Normal heart rate. Normal rhythm. No murmurs, rubs, or gallops.    MS:   All extremities are intact.    Neurologic:   Awake, alert, oriented x 3. No apparent focal neurological           defect.      Data Reviewed:   I have personally  reviewed following labs and imaging studies:  Labs: Labs show the following:   Basic Metabolic Panel: Recent Labs  Lab 07/09/21 0224 07/09/21 0535 07/09/21 0725 07/09/21 0900 07/09/21 1314 07/10/21 0517  NA 122* 124*  --  127* 128* 132*  K 2.9* 2.8*  --  2.8* 3.1* 2.7*  CL 75*  --   --  87* 90* 95*  CO2 23  --   --  _0 GLUCOSE 484*  --   --  212* 262* 203*  BUN 21*  --   --  _1 CREATININE 1.33*  --   --  0.98 0.92 0.80  CALCIUM 8.6*  --   --  7.8* 7.9* 8.3*  MG  --   --  2.1  --   --   --    GFR Estimated Creatinine Clearance: 95.3 mL/min (by C-G formula based on SCr of 0.8 mg/dL). Liver Function Tests: Recent Labs  Lab 07/09/21 0224  AST 13*  ALT 13  ALKPHOS 63  BILITOT 1.3*  PROT 7.1  ALBUMIN 3.3*   Recent Labs  Lab 07/09/21 0224  LIPASE 24   No results for input(s): AMMONIA in the last 168 hours. Coagulation profile No results for input(s): INR, PROTIME in the last 168 hours.  CBC: Recent Labs  Lab 07/09/21 0224 07/09/21 0535  WBC 16.7*  --   NEUTROABS 11.1*  --   HGB 13.4 13.9  HCT 40.4 41.0  MCV 75.5*  --   PLT 550*  --    Cardiac Enzymes: No results for input(s): CKTOTAL, CKMB, CKMBINDEX, TROPONINI in the last 168 hours. BNP (last 3 results) No results for input(s): PROBNP in the last 8760 hours. CBG: Recent Labs  Lab 07/09/21 1618 07/09/21 1717 07/10/21 0015 07/10/21 0816 07/10/21 1120  GLUCAP 186* 161* 174* 214* 188*   D-Dimer: No results for input(s): DDIMER in the last 72 hours. Hgb A1c: Recent Labs    07/09/21 0736  HGBA1C 12.9*   Lipid Profile: No results for input(s): CHOL, HDL, LDLCALC, TRIG, CHOLHDL, LDLDIRECT in the last 72 hours. Thyroid function studies: Recent Labs    07/09/21 1314  TSH 1.286   Anemia work up: No results for input(s): VITAMINB12, FOLATE, FERRITIN, TIBC, IRON, RETICCTPCT in the last 72 hours. Sepsis Labs: Recent Labs  Lab 07/09/21 0224 07/09/21 0725 07/09/21 0928   PROCALCITON  --  <0.10  --   WBC 16.7*  --   --   LATICACIDVEN  --   --  1.9    Microbiology Recent Results (from the past 240 hour(s))  Resp Panel by RT-PCR (Flu A&B, Covid) Nasopharyngeal Swab     Status: None   Collection Time: 07/09/21  5:11 AM   Specimen: Nasopharyngeal Swab; Nasopharyngeal(NP) swabs in vial transport medium  Result Value  Ref Range Status   SARS Coronavirus 2 by RT PCR NEGATIVE NEGATIVE Final    Comment: (NOTE) SARS-CoV-2 target nucleic acids are NOT DETECTED.  The SARS-CoV-2 RNA is generally detectable in upper respiratory specimens during the acute phase of infection. The lowest concentration of SARS-CoV-2 viral copies this assay can detect is 138 copies/mL. A negative result does not preclude SARS-Cov-2 infection and should not be used as the sole basis for treatment or other patient management decisions. A negative result may occur with  improper specimen collection/handling, submission of specimen other than nasopharyngeal swab, presence of viral mutation(s) within the areas targeted by this assay, and inadequate number of viral copies(<138 copies/mL). A negative result must be combined with clinical observations, patient history, and epidemiological information. The expected result is Negative.  Fact Sheet for Patients:  EntrepreneurPulse.com.au  Fact Sheet for Healthcare Providers:  IncredibleEmployment.be  This test is no t yet approved or cleared by the [place] FDA and  has been authorized for detection and/or diagnosis of SARS-CoV-2 by FDA under an Emergency Use Authorization (EUA). This EUA will remain  in effect (meaning this test can be used) for the duration of the COVID-19 declaration under Section 564(b)(1) of the Act, 21 U.S.C.section 360bbb-3(b)(1), unless the authorization is terminated  or revoked sooner.       Influenza A by PCR NEGATIVE NEGATIVE Final   Influenza B by PCR NEGATIVE  NEGATIVE Final    Comment: (NOTE) The Xpert Xpress SARS-CoV-2/FLU/RSV plus assay is intended as an aid in the diagnosis of influenza from Nasopharyngeal swab specimens and should not be used as a sole basis for treatment. Nasal washings and aspirates are unacceptable for Xpert Xpress SARS-CoV-2/FLU/RSV testing.  Fact Sheet for Patients: EntrepreneurPulse.com.au  Fact Sheet for Healthcare Providers: IncredibleEmployment.be  This test is not yet approved or cleared by the [place] FDA and has been authorized for detection and/or diagnosis of SARS-CoV-2 by FDA under an Emergency Use Authorization (EUA). This EUA will remain in effect (meaning this test can be used) for the duration of the COVID-19 declaration under Section 564(b)(1) of the Act, 21 U.S.C. section 360bbb-3(b)(1), unless the authorization is terminated or revoked.  Performed at [facility], [place] [address] [place], [place] [address]   Culture, blood (routine x 2)     Status: None (Preliminary result)   Collection Time: 07/09/21  9:28 AM   Specimen: BLOOD  Result Value Ref Range Status   Specimen Description BLOOD LEFT ANTECUBITAL  Final   Special Requests   Final    BOTTLES DRAWN AEROBIC AND ANAEROBIC Blood Culture adequate volume   Culture   Final    NO GROWTH < 24 HOURS Performed at [facility], [place] [address], [place], [place] [address]    Report Status PENDING  Incomplete  Culture, blood (routine x 2)     Status: None (Preliminary result)   Collection Time: 07/09/21  9:45 AM   Specimen: BLOOD  Result Value Ref Range Status   Specimen Description BLOOD SITE NOT SPECIFIED  Final   Special Requests   Final    BOTTLES DRAWN AEROBIC AND ANAEROBIC Blood Culture adequate volume   Culture   Final    NO GROWTH < 24 HOURS Performed at [facility], [place] [address], [place], [place] [address]    Report Status PENDING  Incomplete    Procedures and  diagnostic studies:  CT ABDOMEN PELVIS W CONTRAST  Result Date: 07/09/2021 CLINICAL DATA:  Abdominal pain and fever EXAM: CT ABDOMEN AND PELVIS WITH CONTRAST TECHNIQUE: Multidetector CT imaging of the abdomen and pelvis was performed using the standard protocol following bolus administration of intravenous contrast. CONTRAST:  122m OMNIPAQUE IOHEXOL 350 MG/ML SOLN COMPARISON:  None. FINDINGS: Lower chest: No acute abnormality. Hepatobiliary: No focal liver abnormality is seen. Gallbladder appears normal. No bile duct dilatation is seen. Pancreas: Unremarkable. No pancreatic ductal dilatation or surrounding inflammatory changes. Spleen: Normal in size without focal abnormality. Adrenals/Urinary Tract: Kidneys appear normal without stone or hydronephrosis. No perinephric fluid. Adrenal glands appear normal. No ureteral or bladder calculi are identified. Bladder appears normal, partially decompressed. Stomach/Bowel: No dilated large or small bowel loops. No evidence of bowel wall inflammation. Appendix is normal. Stomach is unremarkable, partially decompressed. Vascular/Lymphatic: No vascular abnormality is identified. No enlarged lymph nodes are seen within the abdomen or pelvis. Reproductive: Uterine fibroid exophytic to the posterior fundus, measuring 3 cm. Adnexal regions are unremarkable without mass or free fluid. Other: No free fluid or abscess collection. No free intraperitoneal air. Musculoskeletal: No acute or significant osseous findings. IMPRESSION: 1. No acute findings within the abdomen or pelvis. No source for abdominal pain identified. No bowel obstruction or evidence of bowel wall inflammation. No evidence of acute solid organ abnormality. No renal or ureteral calculi. Appendix is normal. 2. Uterine fibroid, measuring 3 cm. Electronically Signed   By: SFranki CabotM.D.   On: 07/09/2021 06:13   Portable chest 1 View  Result Date: 07/09/2021 CLINICAL DATA:  Nausea and vomiting.  Weakness.  EXAM: PORTABLE CHEST 1 VIEW COMPARISON:  None. FINDINGS: Heart size and mediastinal contours are within normal limits. Lungs are clear. No pleural effusion or pneumothorax is seen. Osseous structures about the chest are unremarkable. IMPRESSION: No active disease. No evidence of pneumonia or pulmonary edema. Electronically Signed   By: SFranki CabotM.D.   On: 07/09/2021 08:00    Medications:    enoxaparin (LOVENOX) injection  40 mg Subcutaneous Q24H   insulin aspart  0-9 Units Subcutaneous TID WC   insulin glargine-yfgn  10 Units Subcutaneous Daily   insulin starter kit- pen needles  1 kit Other Once   living well with diabetes book   Does not apply Once   pantoprazole  40 mg Oral Daily   sodium chloride flush  3 mL Intravenous Q12H   Continuous Infusions:  sodium chloride 75 mL/hr at 07/10/21 1053   ceFEPime (MAXIPIME) IV 2 g (07/10/21 0845)   potassium chloride 10 mEq (07/10/21 1058)   vancomycin       LOS: 1 day   JGeradine Girt [facility]   How to contact the TThe BridgewayAttending or Consulting provider 7[place] covering provider during after hours [place] for this patient?  Check the care team in [facility] Hospitaland look for a) attending/consulting TRH provider listed and b) the TPied[facility] Surgery Centerteam listed Log into [email or web address] and use [place]'s universal password to access. If you do not have the password, please contact the hospital operator. Locate the TUniversity Of Md Shore Medical Ctr At Dorchesterprovider you are looking for under [facility] and page to a number that you can be directly reached. If you still have difficulty reaching the provider, please page the DPorter [facility].(Director on Call) for the Hospitalists listed on amion for assistance.  07/10/2021, 11:24 AM

## 2021-07-11 DIAGNOSIS — N390 Urinary tract infection, site not specified: Secondary | ICD-10-CM

## 2021-07-11 DIAGNOSIS — E876 Hypokalemia: Secondary | ICD-10-CM

## 2021-07-11 DIAGNOSIS — D75839 Thrombocytosis, unspecified: Secondary | ICD-10-CM

## 2021-07-11 DIAGNOSIS — R6510 Systemic inflammatory response syndrome (SIRS) of non-infectious origin without acute organ dysfunction: Secondary | ICD-10-CM

## 2021-07-11 DIAGNOSIS — N179 Acute kidney failure, unspecified: Secondary | ICD-10-CM

## 2021-07-11 DIAGNOSIS — E871 Hypo-osmolality and hyponatremia: Secondary | ICD-10-CM

## 2021-07-11 DIAGNOSIS — R112 Nausea with vomiting, unspecified: Secondary | ICD-10-CM

## 2021-07-11 DIAGNOSIS — A419 Sepsis, unspecified organism: Secondary | ICD-10-CM

## 2021-07-11 DIAGNOSIS — R739 Hyperglycemia, unspecified: Secondary | ICD-10-CM

## 2021-07-11 LAB — CBC
HCT: 28.1 % — ABNORMAL LOW (ref 36.0–46.0)
Hemoglobin: 9.3 g/dL — ABNORMAL LOW (ref 12.0–15.0)
MCH: 25.6 pg — ABNORMAL LOW (ref 26.0–34.0)
MCHC: 33.1 g/dL (ref 30.0–36.0)
MCV: 77.4 fL — ABNORMAL LOW (ref 80.0–100.0)
Platelets: 337 10*3/uL (ref 150–400)
RBC: 3.63 MIL/uL — ABNORMAL LOW (ref 3.87–5.11)
RDW: 14.3 % (ref 11.5–15.5)
WBC: 12.9 10*3/uL — ABNORMAL HIGH (ref 4.0–10.5)
nRBC: 0 % (ref 0.0–0.2)

## 2021-07-11 LAB — COMPREHENSIVE METABOLIC PANEL
ALT: 10 U/L (ref 0–44)
AST: 13 U/L — ABNORMAL LOW (ref 15–41)
Albumin: 2.4 g/dL — ABNORMAL LOW (ref 3.5–5.0)
Alkaline Phosphatase: 39 U/L (ref 38–126)
Anion gap: 11 (ref 5–15)
BUN: 5 mg/dL — ABNORMAL LOW (ref 6–20)
CO2: 23 mmol/L (ref 22–32)
Calcium: 8 mg/dL — ABNORMAL LOW (ref 8.9–10.3)
Chloride: 100 mmol/L (ref 98–111)
Creatinine, Ser: 0.8 mg/dL (ref 0.44–1.00)
GFR, Estimated: >60 mL/min (ref >60)
Glucose, Bld: 193 mg/dL — ABNORMAL HIGH (ref 70–99)
Potassium: 3.2 mmol/L — ABNORMAL LOW (ref 3.5–5.1)
Sodium: 134 mmol/L — ABNORMAL LOW (ref 135–145)
Total Bilirubin: 0.9 mg/dL (ref 0.3–1.2)
Total Protein: 5.3 g/dL — ABNORMAL LOW (ref 6.5–8.1)

## 2021-07-11 LAB — GLUCOSE, CAPILLARY
Glucose-Capillary: 297 mg/dL — ABNORMAL HIGH (ref 70–99)
Glucose-Capillary: 216 mg/dL — ABNORMAL HIGH (ref 70–99)
Glucose-Capillary: 175 mg/dL — ABNORMAL HIGH (ref 70–99)
Glucose-Capillary: 141 mg/dL — ABNORMAL HIGH (ref 70–99)

## 2021-07-11 MED ORDER — POTASSIUM CHLORIDE CRYS ER 20 MEQ PO TBCR
40.0000 meq | EXTENDED_RELEASE_TABLET | ORAL | Status: AC
Start: 2021-07-11 — End: 2021-07-11
  Administered 2021-07-11 (×2): 40 meq via ORAL
  Filled 2021-07-11 (×2): qty 2

## 2021-07-11 MED ORDER — METOCLOPRAMIDE HCL 5 MG/ML IJ SOLN: 10.0000 mg | Freq: Four times a day (QID) | INTRAMUSCULAR | Status: DC | PRN

## 2021-07-11 NOTE — TOC Initial Note (Signed)
Transition of Care [facility]) - Initial/Assessment Note    Patient Details  Name: [name] MRN: [id number] Date of Birth: 1981-07-23  [facility] of [facility]) CM/SW Contact:    Tom-Johnson, Renea Ee, RN Phone Number: 07/11/2021, 11:25 AM  Clinical Narrative:                 CM spoke with patient at bedside about TOC needs for post hospital transition. Presented with complaints of nausea and vomiting for 1 week. HgbA1c noted to be elevated to >12. Denies being aware of high blood sugar. States she lives with her significant other [name] and they have one child. Mother and siblings assist with care. Independent and able to drive self prior to hospitalization. Currently employed by the [employer]. Does not have DME's. Has a PCP and Medically Insured by [employer]. Denies any needs. Medical workup continues. CM will continue to follow with needs.     Expected Discharge Plan: Home/Self Care Barriers to Discharge: Continued Medical Work up   Patient Goals and CMS Choice Patient states their goals for this hospitalization and ongoing recovery are:: To go home. CMS Medicare.gov Compare Post Acute Care list provided to:: Patient    Expected Discharge Plan and Services Expected Discharge Plan: Home/Self Care   Discharge Planning Services: CM Consult   Living arrangements for the past 2 months: Single Family Home                 DME Arranged: N/A DME Agency: NA       HH Arranged: NA HH Agency: NA        Prior Living Arrangements/Services Living arrangements for the past 2 months: Single Family Home Lives with:: Minor Children, Spouse Patient language and need for interpreter reviewed:: Yes Do you feel safe going back to the place where you live?: Yes      Need for Family Participation in Patient Care: Yes (Comment) Care giver support system in place?: Yes (comment)   Criminal Activity/Legal Involvement Pertinent to Current  Situation/Hospitalization: No - Comment as needed  Activities of Daily Living      Permission Sought/Granted Permission sought to share information with : Case Manager, Family Supports Permission granted to share information with : Yes, Verbal Permission Granted              Emotional Assessment Appearance:: Appears stated age Attitude/Demeanor/Rapport: Engaged Affect (typically observed): Accepting, Calm Orientation: : Oriented to Self, Oriented to Place, Oriented to  Time, Oriented to Situation Alcohol / Substance Use: Not Applicable Psych Involvement: No (comment)  Admission diagnosis:  Dehydration [E86.0] Uncontrollable vomiting [R11.10] Hypokalemia [E87.6] Hyperglycemia [R73.9] SIRS (systemic inflammatory response syndrome) (HCC) [R65.10] AKI (acute kidney injury) ([place]) [N17.9] Sepsis ([place]) [A41.9] Patient Active Problem List   Diagnosis Date Noted   SIRS (systemic inflammatory response syndrome) ([place]) 07/09/2021   Nausea and vomiting 07/09/2021   Hypokalemia 07/09/2021   Hypocalcemia 07/09/2021   Hypochloremia 07/09/2021   Hyponatremia 07/09/2021   AKI (acute kidney injury) ([place]) 07/09/2021   Thrombocytosis 07/09/2021   Hyperbilirubinemia 07/09/2021   PCP:  Shanon Rosser, PA-C Pharmacy:   [id number] [place], [place] [address] Phone: [phone] Fax: [phone]     Social Determinants of Health (SDOH) Interventions    Readmission Risk Interventions No flowsheet data found.

## 2021-07-11 NOTE — Progress Notes (Addendum)
Patient ID: [name], female   DOB: 1980-11-23, 40 y.o.   MRN: [id number]  PROGRESS NOTE    [name]  DGU:[id number] DOB: [birth year] DOA: 07/09/2021 PCP: Shanon Rosser, PA-C   Brief Narrative:  40 year old female with no past medical history presented with nausea and vomiting for a week.  She was found to have A1c more than 12.  She was also found to have possible sepsis and started on broad-spectrum antibiotics.  Assessment & Plan:   Suspected sepsis: Present on admission Possible UTI -Presented with temperature of 100.9, tachycardic, tachypneic with leukocytosis with possibility of UTI -Cultures negative so far.  COVID-19 and influenza testing negative. -No respiratory symptoms.  Chest x-ray negative for infiltrates.  CT abdomen was negative for acute intra-abdominal pathology.  Currently on vancomycin and cefepime.  DC vancomycin; continue cefepime for 1 more day.  New diagnosis of diabetes mellitus type 2 with hyperglycemia -Presented with hemoglobin of more than 12.  Blood sugars improving.  Continue Lantus along with CBGs with SSI.  Follow diabetes coordinator recommendations and dietitian recommendations.  Carb modified diet  Leukocytosis -Improving  Thrombocytosis -Improved  Hyponatremia -Improving.  Sodium 134 today  Hypokalemia -replace.  Repeat a.m. labs  Hypocalcemia -Improved  Acute kidney injury  -resolved with IV fluids.    DVT prophylaxis: Lovenox Code Status: Full Family Communication: significant other at bedside Disposition Plan: Status is: Inpatient  Remains inpatient appropriate because: Still on IV antibiotics, pending cultures  Consultants: None  Procedures: None  Antimicrobials:  Anti-infectives (From admission, onward)    Start     Dose/Rate Route Frequency Ordered Stop   07/10/21 1800  Vancomycin (VANCOCIN) 1,250 mg in sodium chloride 0.9 % 250 mL IVPB  Status:  Discontinued        1,250 mg 166.7 mL/hr over 90 Minutes Intravenous  Every 24 hours 07/09/21 1855 07/10/21 1411   07/10/21 1700  ceFEPIme (MAXIPIME) 2 g in sodium chloride 0.9 % 100 mL IVPB        2 g 200 mL/hr over 30 Minutes Intravenous Every 8 hours 07/10/21 1411     07/10/21 1500  vancomycin (VANCOCIN) IVPB 1000 mg/200 mL premix        1,000 mg 200 mL/hr over 60 Minutes Intravenous Every 12 hours 07/10/21 1411     07/10/21 0930  Vancomycin (VANCOCIN) 1,250 mg in sodium chloride 0.9 % 250 mL IVPB  Status:  Discontinued        1,250 mg 166.7 mL/hr over 90 Minutes Intravenous Every 24 hours 07/09/21 0840 07/09/21 1855   07/09/21 2100  ceFEPIme (MAXIPIME) 2 g in sodium chloride 0.9 % 100 mL IVPB  Status:  Discontinued        2 g 200 mL/hr over 30 Minutes Intravenous Every 12 hours 07/09/21 0840 07/10/21 1411   07/09/21 0830  ceFEPIme (MAXIPIME) 2 g in sodium chloride 0.9 % 100 mL IVPB        2 g 200 mL/hr over 30 Minutes Intravenous  Once 07/09/21 0818 07/09/21 1106   07/09/21 0830  vancomycin (VANCOREADY) IVPB 1500 mg/300 mL        1,500 mg 150 mL/hr over 120 Minutes Intravenous  Once 07/09/21 0818 07/09/21 1910        Subjective: Patient seen and examined at bedside.  Still complains of intermittent nausea.  Denies overnight fever or vomiting.  Denies worsening shortness of breath or chest pain.  Objective: Vitals:   07/10/21 0759 07/10/21 2112 07/11/21 0453 07/11/21 0902  BP: 140/80  117/60 123/87 138/81  Pulse: 97 100 80 98  Resp: _0 Temp: 98.7 F (37.1 C) 98.5 F (36.9 C) 98.7 F (37.1 C) 98 F (36.7 C)  TempSrc: Oral Oral Oral   SpO2: 99% 99%  99%  Weight:  78.7 kg    Height:        Intake/Output Summary (Last 24 hours) at 07/11/2021 1303 Last data filed at 07/11/2021 0600 Gross per 24 hour  Intake 2209.8 ml  Output 0 ml  Net 2209.8 ml   Filed Weights   07/09/21 0700 07/10/21 0621 07/10/21 2112  Weight: 77.7 kg 75.9 kg 78.7 kg    Examination:  General exam: Appears calm and comfortable.  Currently on room  air. Respiratory system: Bilateral decreased breath sounds at bases Cardiovascular system: S1 & S2 heard, Rate controlled Gastrointestinal system: Abdomen is nondistended, soft and nontender. Normal bowel sounds heard. Extremities: No cyanosis, clubbing, edema  Central nervous system: Alert and oriented. No focal neurological deficits. Moving extremities Skin: No rashes, lesions or ulcers Psychiatry: Affect is mostly flat.   Data Reviewed: I have personally reviewed following labs and imaging studies  CBC: Recent Labs  Lab 07/09/21 0224 07/09/21 0535 07/11/21 0401  WBC 16.7*  --  12.9*  NEUTROABS 11.1*  --   --   HGB 13.4 13.9 9.3*  HCT 40.4 41.0 28.1*  MCV 75.5*  --  77.4*  PLT 550*  --  355   Basic Metabolic Panel: Recent Labs  Lab 07/09/21 0224 07/09/21 0535 07/09/21 0725 07/09/21 0900 07/09/21 1314 07/10/21 0517 07/10/21 1541 07/11/21 0401  NA 122* 124*  --  127* 128* 132*  --  134*  K 2.9* 2.8*  --  2.8* 3.1* 2.7* 3.4* 3.2*  CL 75*  --   --  87* 90* 95*  --  100  CO2 23  --   --  _1 --  23  GLUCOSE 484*  --   --  212* 262* 203*  --  193*  BUN 21*  --   --  _2 --  5*  CREATININE 1.33*  --   --  0.98 0.92 0.80  --  0.80  CALCIUM 8.6*  --   --  7.8* 7.9* 8.3*  --  8.0*  MG  --   --  2.1  --   --   --   --   --    GFR: Estimated Creatinine Clearance: 97 mL/min (by C-G formula based on SCr of 0.8 mg/dL). Liver Function Tests: Recent Labs  Lab 07/09/21 0224 07/11/21 0401  AST 13* 13*  ALT 13 10  ALKPHOS 63 39  BILITOT 1.3* 0.9  PROT 7.1 5.3*  ALBUMIN 3.3* 2.4*   Recent Labs  Lab 07/09/21 0224  LIPASE 24   No results for input(s): AMMONIA in the last 168 hours. Coagulation Profile: No results for input(s): INR, PROTIME in the last 168 hours. Cardiac Enzymes: No results for input(s): CKTOTAL, CKMB, CKMBINDEX, TROPONINI in the last 168 hours. BNP (last 3 results) No results for input(s): PROBNP in the last 8760 hours. HbA1C: Recent  Labs    07/09/21 0736  HGBA1C 12.9*   CBG: Recent Labs  Lab 07/10/21 1120 07/10/21 1625 07/10/21 2114 07/11/21 0640 07/11/21 1124  GLUCAP 188* 305* 129* 297* 216*   Lipid Profile: No results for input(s): CHOL, HDL, LDLCALC, TRIG, CHOLHDL, LDLDIRECT in the last 72 hours. Thyroid Function Tests: Recent Labs  07/09/21 1314  TSH 1.286   Anemia Panel: No results for input(s): VITAMINB12, FOLATE, FERRITIN, TIBC, IRON, RETICCTPCT in the last 72 hours. Sepsis Labs: Recent Labs  Lab 07/09/21 0725 07/09/21 0928  PROCALCITON <0.10  --   LATICACIDVEN  --  1.9    Recent Results (from the past 240 hour(s))  Resp Panel by RT-PCR (Flu A&B, Covid) Nasopharyngeal Swab     Status: None   Collection Time: 07/09/21  5:11 AM   Specimen: Nasopharyngeal Swab; Nasopharyngeal(NP) swabs in vial transport medium  Result Value Ref Range Status   SARS Coronavirus 2 by RT PCR NEGATIVE NEGATIVE Final    Comment: (NOTE) SARS-CoV-2 target nucleic acids are NOT DETECTED.  The SARS-CoV-2 RNA is generally detectable in upper respiratory specimens during the acute phase of infection. The lowest concentration of SARS-CoV-2 viral copies this assay can detect is 138 copies/mL. A negative result does not preclude SARS-Cov-2 infection and should not be used as the sole basis for treatment or other patient management decisions. A negative result may occur with  improper specimen collection/handling, submission of specimen other than nasopharyngeal swab, presence of viral mutation(s) within the areas targeted by this assay, and inadequate number of viral copies(<138 copies/mL). A negative result must be combined with clinical observations, patient history, and epidemiological information. The expected result is Negative.  Fact Sheet for Patients:  EntrepreneurPulse.com.au  Fact Sheet for Healthcare Providers:  IncredibleEmployment.be  This test is no t yet  approved or cleared by the [place] FDA and  has been authorized for detection and/or diagnosis of SARS-CoV-2 by FDA under an Emergency Use Authorization (EUA). This EUA will remain  in effect (meaning this test can be used) for the duration of the COVID-19 declaration under Section 564(b)(1) of the Act, 21 U.S.C.section 360bbb-3(b)(1), unless the authorization is terminated  or revoked sooner.       Influenza A by PCR NEGATIVE NEGATIVE Final   Influenza B by PCR NEGATIVE NEGATIVE Final    Comment: (NOTE) The Xpert Xpress SARS-CoV-2/FLU/RSV plus assay is intended as an aid in the diagnosis of influenza from Nasopharyngeal swab specimens and should not be used as a sole basis for treatment. Nasal washings and aspirates are unacceptable for Xpert Xpress SARS-CoV-2/FLU/RSV testing.  Fact Sheet for Patients: EntrepreneurPulse.com.au  Fact Sheet for Healthcare Providers: IncredibleEmployment.be  This test is not yet approved or cleared by the [place] FDA and has been authorized for detection and/or diagnosis of SARS-CoV-2 by FDA under an Emergency Use Authorization (EUA). This EUA will remain in effect (meaning this test can be used) for the duration of the COVID-19 declaration under Section 564(b)(1) of the Act, 21 U.S.C. section 360bbb-3(b)(1), unless the authorization is terminated or revoked.  Performed at [facility], [place] [address], [place], [place] [address]   Culture, blood (routine x 2)     Status: None (Preliminary result)   Collection Time: 07/09/21  9:28 AM   Specimen: BLOOD  Result Value Ref Range Status   Specimen Description BLOOD LEFT ANTECUBITAL  Final   Special Requests   Final    BOTTLES DRAWN AEROBIC AND ANAEROBIC Blood Culture adequate volume   Culture   Final    NO GROWTH 2 DAYS Performed at [facility], [place] [address] [place], [place] [address]    Report Status PENDING  Incomplete  Culture,  blood (routine x 2)     Status: None (Preliminary result)   Collection Time: 07/09/21  9:45 AM  Specimen: BLOOD  Result Value Ref Range Status   Specimen Description BLOOD SITE NOT SPECIFIED  Final   Special Requests   Final    BOTTLES DRAWN AEROBIC AND ANAEROBIC Blood Culture adequate volume   Culture   Final    NO GROWTH 2 DAYS Performed at [facility], [address] [place], [place] [address]    Report Status PENDING  Incomplete         Radiology Studies: No results found.      Scheduled Meds:  enoxaparin (LOVENOX) injection  40 mg Subcutaneous Q24H   feeding supplement (GLUCERNA SHAKE)  237 mL Oral TID BM   insulin aspart  0-9 Units Subcutaneous TID WC   insulin glargine-yfgn  10 Units Subcutaneous Daily   insulin starter kit- pen needles  1 kit Other Once   living well with diabetes book   Does not apply Once   multivitamin with minerals  1 tablet Oral Daily   pantoprazole  40 mg Oral Daily   sodium chloride flush  3 mL Intravenous Q12H   Continuous Infusions:  ceFEPime (MAXIPIME) IV 2 g (07/11/21 0851)   vancomycin 1,000 mg (07/11/21 0351)          Aline August, MD [facility] 07/11/2021, 1:03 PM

## 2021-07-11 NOTE — Progress Notes (Signed)
Inpatient Diabetes Program Recommendations  AACE/ADA: New Consensus Statement on Inpatient Glycemic Control (2015)  Target Ranges:  Prepandial:   less than 140 mg/dL      Peak postprandial:   less than 180 mg/dL (1-2 hours)      Critically ill patients:  140 - 180 mg/dL   Lab Results  Component Value Date   GLUCAP 216 (H) 07/11/2021   HGBA1C 12.9 (H) 07/09/2021    Review of Glycemic Control  Inpatient Diabetes Program Recommendations:   Spoke with [name] about new diagnosis. Discussed A1C 12.9 (average blood glucose 324 over the past 2-3 months) results with them and explained what an A1C is, basic pathophysiology of DM Type 2, basic home care, basic diabetes diet nutrition principles, importance of checking CBGs and maintaining good CBG control to prevent long-term and short-term complications. Reviewed signs and symptoms of hyperglycemia and hypoglycemia and how to treat hypoglycemia at home. Also reviewed blood sugar goals at home.  RNs to provide ongoing basic DM education at bedside with this patient. Educated patient and spouse on insulin pen use at home. Reviewed contents of insulin flexpen starter kit. Reviewed all steps if insulin pen including attachment of needle, 2-unit air shot, dialing up dose, giving injection, removing needle, disposal of sharps, storage of unused insulin, disposal of insulin etc. Patient able to provide successful return demonstration. Also reviewed troubleshooting with insulin pen. MD to give patient Rxs for insulin pens and insulin pen needles.  Thank you, Nani Gasser. Bernadine Melecio, RN, MSN, CDE  Diabetes Coordinator Inpatient Glycemic Control Team Team Pager [phone] (8am-5pm) 07/11/2021 2:46 PM

## 2021-07-11 NOTE — Progress Notes (Signed)
Pt did a return demonstration and administered her insulin. She did very well.

## 2021-07-11 NOTE — Progress Notes (Signed)
Pt. States she feels nauseous and does not want to "be poked" right now after 2nd time entering room. Notified RN to put in an order when pt. Is ready.

## 2021-07-11 NOTE — Progress Notes (Addendum)
Pt requesting to speak with Dr. Dr. Lenon Ahmadi he will round on pt in the am. Pt states she wont take any medications until she speaks with the Dr.

## 2021-07-11 NOTE — Plan of Care (Signed)
  Problem: Education: Goal: Knowledge of General Education information will improve Description: Including pain rating scale, medication(s)/side effects and non-pharmacologic comfort measures Outcome: Completed/Met

## 2021-07-12 LAB — CBC WITH DIFFERENTIAL/PLATELET
Abs Immature Granulocytes: 0.16 10*3/uL — ABNORMAL HIGH (ref 0.00–0.07)
Basophils Absolute: 0.1 10*3/uL (ref 0.0–0.1)
Basophils Relative: 0 %
Eosinophils Absolute: 0.2 10*3/uL (ref 0.0–0.5)
Eosinophils Relative: 1 %
HCT: 30.5 % — ABNORMAL LOW (ref 36.0–46.0)
Hemoglobin: 10.1 g/dL — ABNORMAL LOW (ref 12.0–15.0)
Immature Granulocytes: 1 %
Lymphocytes Relative: 29 %
Lymphs Abs: 3.5 10*3/uL (ref 0.7–4.0)
MCH: 25.4 pg — ABNORMAL LOW (ref 26.0–34.0)
MCHC: 33.1 g/dL (ref 30.0–36.0)
MCV: 76.6 fL — ABNORMAL LOW (ref 80.0–100.0)
Monocytes Absolute: 1.3 10*3/uL — ABNORMAL HIGH (ref 0.1–1.0)
Monocytes Relative: 11 %
Neutro Abs: 6.9 10*3/uL (ref 1.7–7.7)
Neutrophils Relative %: 58 %
Platelets: 377 10*3/uL (ref 150–400)
RBC: 3.98 MIL/uL (ref 3.87–5.11)
RDW: 14.5 % (ref 11.5–15.5)
WBC: 12.1 10*3/uL — ABNORMAL HIGH (ref 4.0–10.5)
nRBC: 0 % (ref 0.0–0.2)

## 2021-07-12 LAB — MAGNESIUM: Magnesium: 1.9 mg/dL (ref 1.7–2.4)

## 2021-07-12 LAB — BASIC METABOLIC PANEL
Anion gap: 12 (ref 5–15)
BUN: <5 mg/dL — ABNORMAL LOW (ref 6–20)
CO2: 25 mmol/L (ref 22–32)
Calcium: 8.4 mg/dL — ABNORMAL LOW (ref 8.9–10.3)
Chloride: 98 mmol/L (ref 98–111)
Creatinine, Ser: 0.68 mg/dL (ref 0.44–1.00)
GFR, Estimated: >60 mL/min (ref >60)
Glucose, Bld: 162 mg/dL — ABNORMAL HIGH (ref 70–99)
Potassium: 3.4 mmol/L — ABNORMAL LOW (ref 3.5–5.1)
Sodium: 135 mmol/L (ref 135–145)

## 2021-07-12 LAB — GLUCOSE, CAPILLARY: Glucose-Capillary: 176 mg/dL — ABNORMAL HIGH (ref 70–99)

## 2021-07-12 MED ORDER — ONDANSETRON HCL 4 MG PO TABS: 4.0000 mg | ORAL_TABLET | Freq: Four times a day (QID) | ORAL | 0 refills | Status: DC | PRN

## 2021-07-12 MED ORDER — ALUM & MAG HYDROXIDE-SIMETH 400-400-40 MG/5ML PO SUSP: 10.0000 mL | Freq: Four times a day (QID) | ORAL | 0 refills | Status: DC | PRN

## 2021-07-12 MED ORDER — POTASSIUM CHLORIDE CRYS ER 20 MEQ PO TBCR
40.0000 meq | EXTENDED_RELEASE_TABLET | Freq: Once | ORAL | Status: AC
  Administered 2021-07-12: 40 meq via ORAL
  Filled 2021-07-12: qty 2

## 2021-07-12 MED ORDER — PANTOPRAZOLE SODIUM 40 MG PO TBEC: 40.0000 mg | DELAYED_RELEASE_TABLET | Freq: Every day | ORAL | 0 refills | Status: DC

## 2021-07-12 MED ORDER — BLOOD GLUCOSE MONITOR KIT: PACK | 0 refills | Status: DC

## 2021-07-12 MED ORDER — CEPHALEXIN 500 MG PO CAPS: 500.0000 mg | ORAL_CAPSULE | Freq: Three times a day (TID) | ORAL | 0 refills | Status: AC

## 2021-07-12 MED ORDER — INSULIN GLARGINE 100 UNIT/ML SOLOSTAR PEN: 10.0000 [IU] | PEN_INJECTOR | Freq: Every day | SUBCUTANEOUS | 0 refills | Status: DC

## 2021-07-12 MED ORDER — INSULIN PEN NEEDLE 32G X 4 MM MISC: 0 refills | Status: DC

## 2021-07-12 NOTE — Progress Notes (Signed)
Patient refused IV antibiotics. Refusing to get an IV site as well.

## 2021-07-12 NOTE — Discharge Summary (Signed)
Physician Discharge Summary  [name] VPX:[id number] DOB: 05-07-1981 DOA: 07/09/2021  PCP: Shanon Rosser, PA-C  Admit date: 07/09/2021 Discharge date: 07/12/2021  Admitted From: Home Disposition: Home  Recommendations for Outpatient Follow-up:  Follow up with PCP in 1 week with repeat CBC/BMP Follow up in ED if symptoms worsen or new appear   Home Health: No Equipment/Devices: None  Discharge Condition: Stable CODE STATUS: Full Diet recommendation: Carb modified  Brief/Interim Summary: 40 year old female with no past medical history presented with nausea and vomiting for a week.  She was found to have A1c more than 12.  She was also found to have possible sepsis and started on broad-spectrum antibiotics.  During the hospitalization, her condition has improved.  She is still having intermittent vomiting but improving.  No recent fevers.  Blood cultures have remained negative so far.  Vancomycin has been discontinued.  Blood sugars have much improved after starting long-acting insulin.  She will be discharged home today on oral Keflex and long-acting insulin with outpatient follow-up with PCP.  Discharge Diagnoses:   Suspected sepsis: Present on admission; resolved Possible UTI -Presented with temperature of 100.9, tachycardic, tachypneic with leukocytosis with possibility of UTI -Cultures negative so far.  COVID-19 and influenza testing negative. -No respiratory symptoms.  Chest x-ray negative for infiltrates.  CT abdomen was negative for acute intra-abdominal pathology.  Currently on vancomycin and cefepime.  DC'd vancomycin; currently on cefepime; last dose yesterday  -Afebrile, hemodynamically stable.  Cultures negative so far.  Discharged home on Keflex for 5 more days.  Outpatient follow-up with PCP.    New diagnosis of diabetes mellitus type 2 with hyperglycemia -Presented with hemoglobin of more than 12.  Blood sugars improving.  Currently on long-acting insulin with CBGs  with SSI.  Diabetes coordinator evaluation appreciated.   -Continue carb modified diet -Outpatient follow-up with PCP.  Continue Lantus 10 units daily upon discharge.  Leukocytosis -Improving  Thrombocytosis -Improved  Hyponatremia -Resolved.  Hypokalemia -replace prior to discharge.  Hypocalcemia -Improved  Acute kidney injury  -resolved with IV fluids.  Microcytic anemia -Questionable cause.  No signs of bleeding.  Outpatient follow-up with     Discharge Instructions  Discharge Instructions     Diet Carb Modified   Complete by: As directed    Increase activity slowly   Complete by: As directed       Allergies as of 07/12/2021   No Known Allergies      Medication List     TAKE these medications    alum & mag hydroxide-simeth 400-400-40 MG/5ML suspension Commonly known as: MAALOX PLUS Take 10 mLs by mouth every 6 (six) hours as needed for indigestion (heart burn).   blood glucose meter kit and supplies Kit Dispense based on patient and insurance preference. Use up to four times daily as directed.   cephALEXin 500 MG capsule Commonly known as: KEFLEX Take 1 capsule (500 mg total) by mouth 3 (three) times daily for 5 days.   insulin glargine 100 UNIT/ML Solostar Pen Commonly known as: LANTUS Inject 10 Units into the skin daily.   Insulin Pen Needle 32G X 4 MM Misc Lantus 10 units daily   ondansetron 4 MG tablet Commonly known as: ZOFRAN Take 1 tablet (4 mg total) by mouth every 6 (six) hours as needed for nausea.   pantoprazole 40 MG tablet Commonly known as: PROTONIX Take 1 tablet (40 mg total) by mouth daily.        Follow-up Information     Long,  Scott, PA-C. Schedule an appointment as soon as possible for a visit in 1 week(s).   Specialty: Physician Assistant Why: with repeat cbc/bmp Contact information: [place] [id number] [phone]                No Known  Allergies  Consultations: None   Procedures/Studies: CT ABDOMEN PELVIS W CONTRAST  Result Date: 07/09/2021 CLINICAL DATA:  Abdominal pain and fever EXAM: CT ABDOMEN AND PELVIS WITH CONTRAST TECHNIQUE: Multidetector CT imaging of the abdomen and pelvis was performed using the standard protocol following bolus administration of intravenous contrast. CONTRAST:  147m OMNIPAQUE IOHEXOL 350 MG/ML SOLN COMPARISON:  None. FINDINGS: Lower chest: No acute abnormality. Hepatobiliary: No focal liver abnormality is seen. Gallbladder appears normal. No bile duct dilatation is seen. Pancreas: Unremarkable. No pancreatic ductal dilatation or surrounding inflammatory changes. Spleen: Normal in size without focal abnormality. Adrenals/Urinary Tract: Kidneys appear normal without stone or hydronephrosis. No perinephric fluid. Adrenal glands appear normal. No ureteral or bladder calculi are identified. Bladder appears normal, partially decompressed. Stomach/Bowel: No dilated large or small bowel loops. No evidence of bowel wall inflammation. Appendix is normal. Stomach is unremarkable, partially decompressed. Vascular/Lymphatic: No vascular abnormality is identified. No enlarged lymph nodes are seen within the abdomen or pelvis. Reproductive: Uterine fibroid exophytic to the posterior fundus, measuring 3 cm. Adnexal regions are unremarkable without mass or free fluid. Other: No free fluid or abscess collection. No free intraperitoneal air. Musculoskeletal: No acute or significant osseous findings. IMPRESSION: 1. No acute findings within the abdomen or pelvis. No source for abdominal pain identified. No bowel obstruction or evidence of bowel wall inflammation. No evidence of acute solid organ abnormality. No renal or ureteral calculi. Appendix is normal. 2. Uterine fibroid, measuring 3 cm. Electronically Signed   By: SFranki CabotM.D.   On: 07/09/2021 06:13   Portable chest 1 View  Result Date: 07/09/2021 CLINICAL DATA:   Nausea and vomiting.  Weakness. EXAM: PORTABLE CHEST 1 VIEW COMPARISON:  None. FINDINGS: Heart size and mediastinal contours are within normal limits. Lungs are clear. No pleural effusion or pneumothorax is seen. Osseous structures about the chest are unremarkable. IMPRESSION: No active disease. No evidence of pneumonia or pulmonary edema. Electronically Signed   By: SFranki CabotM.D.   On: 07/09/2021 08:00      Subjective: Patient seen and examined at bedside.  She denies any current nausea, vomiting or fever.  Feels okay going home today. Discharge Exam: Vitals:   07/11/21 2127 07/12/21 0707  BP: 118/69 125/75  Pulse: 99 94  Resp: 18 18  Temp: 99.4 F (37.4 C) 98.5 F (36.9 C)  SpO2: 98% 99%    General: Pt is alert, awake, not in acute distress.  Currently on room air. Cardiovascular: rate controlled, S1/S2 + Respiratory: bilateral decreased breath sounds at bases Abdominal: Soft, NT, ND, bowel sounds + Extremities: no edema, no cyanosis    The results of significant diagnostics from this hospitalization (including imaging, microbiology, ancillary and laboratory) are listed below for reference.     Microbiology: Recent Results (from the past 240 hour(s))  Resp Panel by RT-PCR (Flu A&B, Covid) Nasopharyngeal Swab     Status: None   Collection Time: 07/09/21  5:11 AM   Specimen: Nasopharyngeal Swab; Nasopharyngeal(NP) swabs in vial transport medium  Result Value Ref Range Status   SARS Coronavirus 2 by RT PCR NEGATIVE NEGATIVE Final    Comment: (NOTE) SARS-CoV-2 target nucleic acids are NOT DETECTED.  The  SARS-CoV-2 RNA is generally detectable in upper respiratory specimens during the acute phase of infection. The lowest concentration of SARS-CoV-2 viral copies this assay can detect is 138 copies/mL. A negative result does not preclude SARS-Cov-2 infection and should not be used as the sole basis for treatment or other patient management decisions. A negative result may  occur with  improper specimen collection/handling, submission of specimen other than nasopharyngeal swab, presence of viral mutation(s) within the areas targeted by this assay, and inadequate number of viral copies(<138 copies/mL). A negative result must be combined with clinical observations, patient history, and epidemiological information. The expected result is Negative.  Fact Sheet for Patients:  EntrepreneurPulse.com.au  Fact Sheet for Healthcare Providers:  IncredibleEmployment.be  This test is no t yet approved or cleared by the [place] FDA and  has been authorized for detection and/or diagnosis of SARS-CoV-2 by FDA under an Emergency Use Authorization (EUA). This EUA will remain  in effect (meaning this test can be used) for the duration of the COVID-19 declaration under Section 564(b)(1) of the Act, 21 U.S.C.section 360bbb-3(b)(1), unless the authorization is terminated  or revoked sooner.       Influenza A by PCR NEGATIVE NEGATIVE Final   Influenza B by PCR NEGATIVE NEGATIVE Final    Comment: (NOTE) The Xpert Xpress SARS-CoV-2/FLU/RSV plus assay is intended as an aid in the diagnosis of influenza from Nasopharyngeal swab specimens and should not be used as a sole basis for treatment. Nasal washings and aspirates are unacceptable for Xpert Xpress SARS-CoV-2/FLU/RSV testing.  Fact Sheet for Patients: EntrepreneurPulse.com.au  Fact Sheet for Healthcare Providers: IncredibleEmployment.be  This test is not yet approved or cleared by the [place] FDA and has been authorized for detection and/or diagnosis of SARS-CoV-2 by FDA under an Emergency Use Authorization (EUA). This EUA will remain in effect (meaning this test can be used) for the duration of the COVID-19 declaration under Section 564(b)(1) of the Act, 21 U.S.C. section 360bbb-3(b)(1), unless the authorization is terminated  or revoked.  Performed at [facility], [place] [address], [place], [place] [address]   Culture, blood (routine x 2)     Status: None (Preliminary result)   Collection Time: 07/09/21  9:28 AM   Specimen: BLOOD  Result Value Ref Range Status   Specimen Description BLOOD LEFT ANTECUBITAL  Final   Special Requests   Final    BOTTLES DRAWN AEROBIC AND ANAEROBIC Blood Culture adequate volume   Culture   Final    NO GROWTH 3 DAYS Performed at [facility], [place] [address] [place], [place] [address]    Report Status PENDING  Incomplete  Culture, blood (routine x 2)     Status: None (Preliminary result)   Collection Time: 07/09/21  9:45 AM   Specimen: BLOOD  Result Value Ref Range Status   Specimen Description BLOOD SITE NOT SPECIFIED  Final   Special Requests   Final    BOTTLES DRAWN AEROBIC AND ANAEROBIC Blood Culture adequate volume   Culture   Final    NO GROWTH 3 DAYS Performed at [facility], [address], [place], [place] [address]    Report Status PENDING  Incomplete     Labs: BNP (last 3 results) No results for input(s): BNP in the last 8760 hours. Basic Metabolic Panel: Recent Labs  Lab 07/09/21 0725 07/09/21 0900 07/09/21 1314 07/10/21 0517 07/10/21 1541 07/11/21 0401 07/12/21 0247  NA  --  127* 128* 132*  --  134* 135  K  --  2.8* 3.1* 2.7* 3.4* 3.2* 3.4*  CL  --  87* 90* 95*  --  100 98  CO2  --  '25 26 24  ' --  23 25  GLUCOSE  --  212* 262* 203*  --  193* 162*  BUN  --  '17 14 10  ' --  5* <5*  CREATININE  --  0.98 0.92 0.80  --  0.80 0.68  CALCIUM  --  7.8* 7.9* 8.3*  --  8.0* 8.4*  MG 2.1  --   --   --   --   --  1.9   Liver Function Tests: Recent Labs  Lab 07/09/21 0224 07/11/21 0401  AST 13* 13*  ALT 13 10  ALKPHOS 63 39  BILITOT 1.3* 0.9  PROT 7.1 5.3*  ALBUMIN 3.3* 2.4*   Recent Labs  Lab 07/09/21 0224  LIPASE 24   No results for input(s): AMMONIA in the last 168 hours. CBC: Recent Labs  Lab 07/09/21 0224  07/09/21 0535 07/11/21 0401 07/12/21 0247  WBC 16.7*  --  12.9* 12.1*  NEUTROABS 11.1*  --   --  6.9  HGB 13.4 13.9 9.3* 10.1*  HCT 40.4 41.0 28.1* 30.5*  MCV 75.5*  --  77.4* 76.6*  PLT 550*  --  337 377   Cardiac Enzymes: No results for input(s): CKTOTAL, CKMB, CKMBINDEX, TROPONINI in the last 168 hours. BNP: Invalid input(s): POCBNP CBG: Recent Labs  Lab 07/11/21 0640 07/11/21 1124 07/11/21 1621 07/11/21 2127 07/12/21 0659  GLUCAP 297* 216* 175* 141* 176*   D-Dimer No results for input(s): DDIMER in the last 72 hours. Hgb A1c No results for input(s): HGBA1C in the last 72 hours. Lipid Profile No results for input(s): CHOL, HDL, LDLCALC, TRIG, CHOLHDL, LDLDIRECT in the last 72 hours. Thyroid function studies Recent Labs    07/09/21 1314  TSH 1.286   Anemia work up No results for input(s): VITAMINB12, FOLATE, FERRITIN, TIBC, IRON, RETICCTPCT in the last 72 hours. Urinalysis    Component Value Date/Time   COLORURINE YELLOW 07/10/2021 1800   APPEARANCEUR HAZY (A) 07/10/2021 1800   LABSPEC 1.018 07/10/2021 1800   PHURINE 6.0 07/10/2021 1800   GLUCOSEU 150 (A) 07/10/2021 1800   HGBUR MODERATE (A) 07/10/2021 1800   BILIRUBINUR NEGATIVE 07/10/2021 1800   KETONESUR 80 (A) 07/10/2021 1800   PROTEINUR 30 (A) 07/10/2021 1800   UROBILINOGEN 1.0 09/22/2013 0838   NITRITE NEGATIVE 07/10/2021 1800   LEUKOCYTESUR TRACE (A) 07/10/2021 1800   Sepsis Labs Invalid input(s): PROCALCITONIN,  WBC,  LACTICIDVEN Microbiology Recent Results (from the past 240 hour(s))  Resp Panel by RT-PCR (Flu A&B, Covid) Nasopharyngeal Swab     Status: None   Collection Time: 07/09/21  5:11 AM   Specimen: Nasopharyngeal Swab; Nasopharyngeal(NP) swabs in vial transport medium  Result Value Ref Range Status   SARS Coronavirus 2 by RT PCR NEGATIVE NEGATIVE Final    Comment: (NOTE) SARS-CoV-2 target nucleic acids are NOT DETECTED.  The SARS-CoV-2 RNA is generally detectable in upper  respiratory specimens during the acute phase of infection. The lowest concentration of SARS-CoV-2 viral copies this assay can detect is 138 copies/mL. A negative result does not preclude SARS-Cov-2 infection and should not be used as the sole basis for treatment or other patient management decisions. A negative result may occur with  improper specimen collection/handling, submission of specimen other than nasopharyngeal swab, presence of viral mutation(s) within the areas targeted by this assay,  and inadequate number of viral copies(<138 copies/mL). A negative result must be combined with clinical observations, patient history, and epidemiological information. The expected result is Negative.  Fact Sheet for Patients:  EntrepreneurPulse.com.au  Fact Sheet for Healthcare Providers:  IncredibleEmployment.be  This test is no t yet approved or cleared by the [place] FDA and  has been authorized for detection and/or diagnosis of SARS-CoV-2 by FDA under an Emergency Use Authorization (EUA). This EUA will remain  in effect (meaning this test can be used) for the duration of the COVID-19 declaration under Section 564(b)(1) of the Act, 21 U.S.C.section 360bbb-3(b)(1), unless the authorization is terminated  or revoked sooner.       Influenza A by PCR NEGATIVE NEGATIVE Final   Influenza B by PCR NEGATIVE NEGATIVE Final    Comment: (NOTE) The Xpert Xpress SARS-CoV-2/FLU/RSV plus assay is intended as an aid in the diagnosis of influenza from Nasopharyngeal swab specimens and should not be used as a sole basis for treatment. Nasal washings and aspirates are unacceptable for Xpert Xpress SARS-CoV-2/FLU/RSV testing.  Fact Sheet for Patients: EntrepreneurPulse.com.au  Fact Sheet for Healthcare Providers: IncredibleEmployment.be  This test is not yet approved or cleared by the [place] FDA and has been  authorized for detection and/or diagnosis of SARS-CoV-2 by FDA under an Emergency Use Authorization (EUA). This EUA will remain in effect (meaning this test can be used) for the duration of the COVID-19 declaration under Section 564(b)(1) of the Act, 21 U.S.C. section 360bbb-3(b)(1), unless the authorization is terminated or revoked.  Performed at [facility], [place] [address], [place], [place] [address]   Culture, blood (routine x 2)     Status: None (Preliminary result)   Collection Time: 07/09/21  9:28 AM   Specimen: BLOOD  Result Value Ref Range Status   Specimen Description BLOOD LEFT ANTECUBITAL  Final   Special Requests   Final    BOTTLES DRAWN AEROBIC AND ANAEROBIC Blood Culture adequate volume   Culture   Final    NO GROWTH 3 DAYS Performed at [facility], [place] [address], [place], [place] [address]    Report Status PENDING  Incomplete  Culture, blood (routine x 2)     Status: None (Preliminary result)   Collection Time: 07/09/21  9:45 AM   Specimen: BLOOD  Result Value Ref Range Status   Specimen Description BLOOD SITE NOT SPECIFIED  Final   Special Requests   Final    BOTTLES DRAWN AEROBIC AND ANAEROBIC Blood Culture adequate volume   Culture   Final    NO GROWTH 3 DAYS Performed at [facility], [address] [place],  [address]    Report Status PENDING  Incomplete     Time coordinating discharge: 35 minutes  SIGNED:   Aline August, MD  [facility] 07/12/2021, 10:05 AM

## 2021-07-14 LAB — CULTURE, BLOOD (ROUTINE X 2)
Culture: NO GROWTH
Culture: NO GROWTH
Special Requests: ADEQUATE
Special Requests: ADEQUATE

## 2022-02-28 ENCOUNTER — Other Ambulatory Visit: Payer: Self-pay | Admitting: Physician Assistant

## 2022-02-28 DIAGNOSIS — Z1231 Encounter for screening mammogram for malignant neoplasm of breast: Secondary | ICD-10-CM

## 2022-03-28 ENCOUNTER — Encounter: Payer: Self-pay | Admitting: Cardiology

## 2022-03-28 ENCOUNTER — Ambulatory Visit: Payer: Medicaid Other | Admitting: Cardiology

## 2022-03-28 ENCOUNTER — Ambulatory Visit
Admission: RE | Admit: 2022-03-28 | Discharge: 2022-03-28 | Disposition: A | Discharge status: Home / Self Care | Payer: Medicaid Other | Source: Ambulatory Visit | Attending: Physician Assistant | Admitting: Physician Assistant

## 2022-03-28 VITALS — BP 131/84 | HR 98 | Temp 97.8°F | Resp 16 | Ht 64.0 in | Wt 161.0 lb

## 2022-03-28 DIAGNOSIS — R011 Cardiac murmur, unspecified: Secondary | ICD-10-CM

## 2022-03-28 DIAGNOSIS — Z1231 Encounter for screening mammogram for malignant neoplasm of breast: Secondary | ICD-10-CM

## 2022-03-28 DIAGNOSIS — E119 Type 2 diabetes mellitus without complications: Secondary | ICD-10-CM

## 2022-03-28 NOTE — Progress Notes (Signed)
Primary Physician/Referring:  Shanon Rosser, PA-C  Patient ID: [name], female    DOB: 02/03/1981, 41 y.o.   MRN: [id number]  Chief Complaint  Patient presents with   Heart Murmur   New Patient (Initial Visit)   HPI:    [name]  is a 41 y.o. African-American female patient who was recently diagnosed with new onset of diabetes mellitus in September 2022 when she presented with marked generalized weakness, shortness of breath and probable sepsis with a hemoglobin A1c of 12%.  She was referred to me for evaluation of heart murmur.  She is completely asymptomatic.  Past Medical History:  Diagnosis Date   Onychomycosis    History reviewed. No pertinent surgical history. Family History  Problem Relation Age of Onset   Heart disease Mother    Diabetes Mother    Diabetes Maternal Grandmother     Social History   Tobacco Use   Smoking status: Never   Smokeless tobacco: Never  Substance Use Topics   Alcohol use: Yes    Comment: occ   Marital Status: Single  ROS  Review of Systems  Cardiovascular:  Negative for chest pain, dyspnea on exertion and leg swelling.   Objective  Blood pressure 131/84, pulse 98, temperature 97.8 F (36.6 C), resp. rate 16, height _0  (1.626 m), weight 161 lb (73 kg), SpO2 100 %, unknown if currently breastfeeding. Body mass index is 27.64 kg/m.     03/28/2022    8:06 AM 07/12/2021    7:07 AM 07/11/2021    9:27 PM  Vitals with BMI  Height _1     Weight 161 lbs    BMI 40.98    Systolic 119 147 829  Diastolic 84 75 69  Pulse 98 94 99     Physical Exam Neck:     Vascular: No JVD.  Cardiovascular:     Rate and Rhythm: Normal rate and regular rhythm.     Pulses: Intact distal pulses.     Heart sounds: Murmur heard.     Early systolic murmur is present with a grade of 1/6 at the apex.     No gallop.  Pulmonary:     Effort: Pulmonary effort is normal.     Breath sounds: Normal breath sounds.  Abdominal:     General: Bowel sounds  are normal.     Palpations: Abdomen is soft.  Musculoskeletal:     Right lower leg: No edema.     Left lower leg: No edema.     Medications and allergies  No Known Allergies   Medication list after today's encounter   Current Outpatient Medications:    Ascorbic Acid (VITAMIN C) 100 MG tablet, Take 100 mg by mouth daily., Disp: , Rfl:    BLACK CURRANT SEED OIL PO, Take by mouth., Disp: , Rfl:    cholecalciferol (VITAMIN D3) 25 MCG (1000 UNIT) tablet, Take 1,000 Units by mouth daily., Disp: , Rfl:    Elderberry 500 MG CAPS, Take by mouth., Disp: , Rfl:    ferrous sulfate 325 (65 FE) MG EC tablet, Take 325 mg by mouth 3 (three) times daily with meals., Disp: , Rfl:    magnesium oxide (MAG-OX) 400 (240 Mg) MG tablet, Take 400 mg by mouth daily., Disp: , Rfl:    METFORMIN HCL PO, Take 1,000 mg by mouth daily., Disp: , Rfl:    NON FORMULARY, soursop, Disp: , Rfl:    SYNJARDY 12.01-999 MG TABS, Take 1 tablet by  mouth daily., Disp: , Rfl:   Laboratory examination:   Recent Labs    07/10/21 0517 07/10/21 1541 07/11/21 0401 07/12/21 0247  NA 132*  --  134* 135  K 2.7* 3.4* 3.2* 3.4*  CL 95*  --  100 98  CO2 24  --  23 25  GLUCOSE 203*  --  193* 162*  BUN 10  --  5* <5*  CREATININE 0.80  --  0.80 0.68  CALCIUM 8.3*  --  8.0* 8.4*  GFRNONAA >60  --  >60 >60   CrCl cannot be calculated (Patient's most recent lab result is older than the maximum 21 days allowed.).     Latest Ref Rng & Units 07/12/2021    2:47 AM 07/11/2021    4:01 AM 07/10/2021    3:41 PM  CMP  Glucose 70 - 99 mg/dL 162  193    BUN 6 - 20 mg/dL <5  5    Creatinine 0.44 - 1.00 mg/dL 0.68  0.80    Sodium 135 - 145 mmol/L 135  134    Potassium 3.5 - 5.1 mmol/L 3.4  3.2  3.4   Chloride 98 - 111 mmol/L 98  100    CO2 22 - 32 mmol/L 25  23    Calcium 8.9 - 10.3 mg/dL 8.4  8.0    Total Protein 6.5 - 8.1 g/dL  5.3    Total Bilirubin 0.3 - 1.2 mg/dL  0.9    Alkaline Phos 38 - 126 U/L  39    AST 15 - 41 U/L  13     ALT 0 - 44 U/L  10        Latest Ref Rng & Units 07/12/2021    2:47 AM 07/11/2021    4:01 AM 07/09/2021    5:35 AM  CBC  WBC 4.0 - 10.5 K/uL 12.1  12.9    Hemoglobin 12.0 - 15.0 g/dL 10.1  9.3  13.9   Hematocrit 36.0 - 46.0 % 30.5  28.1  41.0   Platelets 150 - 400 K/uL 377  337      Lipid Panel No results for input(s): "CHOL", "TRIG", "[place]", "VLDL", "HDL", "CHOLHDL", "LDLDIRECT" in the last 8760 hours.  HEMOGLOBIN A1C Lab Results  Component Value Date   HGBA1C 12.9 (H) 07/09/2021   MPG 323.53 07/09/2021   TSH Recent Labs    07/09/21 1314  TSH 1.286    Radiology:    Cardiac Studies:    EKG:   EKG 03/28/2022: Normal sinus rhythm at rate of 94 bpm, normal EKG    Assessment     ICD-10-CM   1. Murmur, cardiac  R01.1 EKG 12-Lead    2. Type 2 diabetes mellitus without complication, without long-term current use of insulin (HCC)  E11.9        Medications Discontinued During This Encounter  Medication Reason   alum & mag hydroxide-simeth (MAALOX PLUS) 400-400-40 MG/5ML suspension    blood glucose meter kit and supplies KIT    insulin glargine (LANTUS) 100 UNIT/ML Solostar Pen    Insulin Pen Needle 32G X 4 MM MISC    ondansetron (ZOFRAN) 4 MG tablet    pantoprazole (PROTONIX) 40 MG tablet     No orders of the defined types were placed in this encounter.  Orders Placed This Encounter  Procedures   EKG 12-Lead   Recommendations:   [name] is a 41 y.o. African-American female patient who was recently diagnosed with new onset of  diabetes mellitus in September 2022 when she presented with marked generalized weakness, shortness of breath and probable sepsis with a hemoglobin A1c of 12%.  Patient has made significant lifestyle changes, her diabetes is now very well controlled, she continues to remain active and is completely asymptomatic.  She is a Education officer, museum by profession, is able to climb couple flights of stairs without any dyspnea or chest pain.  She  is a non-smoker, there is no family history of premature coronary disease.  Her physical examination is completely normal except for a physiologic murmur at the apex which is grade 1/6 systolic murmur.  Her EKG is normal as well.  In view of risk factor that is diabetes mellitus well controlled, non-smoker, physical exam being completely normal along with EKG, I do not think she needs an echocardiogram or further cardiac work-up.  Continued primary prevention is indicated.  She is only 41 years of age, she is diabetic, hence would highly recommend keeping LDL closer to <70 mg percent.  She has gained few pounds in weight over the last few months, her BMI is mildly increased and she is overweight and a BMI is 27.6, she is aware of this and will try to get some pounds off.  Continue primary prevention at this point, I will see her back on a as needed basis.    Adrian Prows, MD, [facility] 03/28/2022, 9:49 AM Office: [phone]

## 2022-05-11 ENCOUNTER — Encounter: Payer: Self-pay | Admitting: Gastroenterology

## 2022-06-14 ENCOUNTER — Ambulatory Visit: Payer: Medicaid Other | Admitting: Gastroenterology

## 2022-06-18 ENCOUNTER — Telehealth: Payer: Self-pay | Admitting: Gastroenterology

## 2022-06-18 ENCOUNTER — Ambulatory Visit (INDEPENDENT_AMBULATORY_CARE_PROVIDER_SITE_OTHER): Payer: BC Managed Care – PPO | Admitting: Gastroenterology

## 2022-06-18 ENCOUNTER — Encounter: Payer: Self-pay | Admitting: Gastroenterology

## 2022-06-18 ENCOUNTER — Other Ambulatory Visit (INDEPENDENT_AMBULATORY_CARE_PROVIDER_SITE_OTHER): Payer: BC Managed Care – PPO

## 2022-06-18 VITALS — BP 118/66 | HR 89 | Ht 65.0 in | Wt 164.4 lb

## 2022-06-18 DIAGNOSIS — D509 Iron deficiency anemia, unspecified: Secondary | ICD-10-CM

## 2022-06-18 DIAGNOSIS — E119 Type 2 diabetes mellitus without complications: Secondary | ICD-10-CM | POA: Diagnosis not present

## 2022-06-18 DIAGNOSIS — R112 Nausea with vomiting, unspecified: Secondary | ICD-10-CM

## 2022-06-18 LAB — CBC
HCT: 26.1 % — ABNORMAL LOW (ref 36.0–46.0)
Hemoglobin: 8.2 g/dL — ABNORMAL LOW (ref 12.0–15.0)
MCHC: 31.3 g/dL (ref 30.0–36.0)
MCV: 65.7 fl — ABNORMAL LOW (ref 78.0–100.0)
Platelets: 374 10*3/uL (ref 150.0–400.0)
RBC: 3.98 Mil/uL (ref 3.87–5.11)
RDW: 21.4 % — ABNORMAL HIGH (ref 11.5–15.5)
WBC: 12.7 10*3/uL — ABNORMAL HIGH (ref 4.0–10.5)

## 2022-06-18 LAB — VITAMIN B12: Vitamin B-12: 1279 pg/mL — ABNORMAL HIGH (ref 211–911)

## 2022-06-18 LAB — IBC + FERRITIN
Ferritin: 5.7 ng/mL — ABNORMAL LOW (ref 10.0–291.0)
Iron: 32 ug/dL — ABNORMAL LOW (ref 42–145)
Saturation Ratios: 6.5 % — ABNORMAL LOW (ref 20.0–50.0)
TIBC: 492.8 ug/dL — ABNORMAL HIGH (ref 250.0–450.0)
Transferrin: 352 mg/dL (ref 212.0–360.0)

## 2022-06-18 LAB — FOLATE: Folate: 12.5 ng/mL (ref >5.9)

## 2022-06-18 NOTE — Telephone Encounter (Signed)
Spoke with the patient. Advised her that instruction is the same that we provided her at the office visit today. She has to be here at 1:30 pm for her 2:30 pm EGD procedure, Clear liquid until 11:30 pm. Advised her nothing by mouth after 11:30. All the medication instruction is the same. Patient verbalized understanding.

## 2022-06-18 NOTE — Progress Notes (Signed)
Chief Complaint: GERD, nausea/vomiting   Referring Provider:     Shanon Rosser, PA-C    HPI:     Jazlyne Gauger is a 41 y.o. female with a history of diabetes referred to the Gastroenterology Clinic for evaluation of GERD and nausea/vomiting.  Diagnosed with DM 06/2021 (A1c >12%) during hospital admission. Started having nausea/vomiting and reports being diagnosed with GERD and started on Protonix and MoM with improved PO tolerance. No HB, regurgitation, dysphagia.   Had f/u with PCM, was given promethazine with improvement in n/v. Now uses prn, but would very much like to stop medications.   Taking Synjardy and Metformin. Most recent A1c 6.5% per patient (labs not in EMR for review).  She reports her nausea/vomiting was better when on Synjardy XR, but the XR was not approved for continued use by insurance.   N/v started to recur in June and had f/u with PCM. Was told she was anemic (no labs available for review from Endocrinologist). Has nausea most mornings, typically 4/7 days. Saw her therapist who thought maybe it was anxiety related.   Reviewed prior labs from 07/12/2021: - H/H 10.1/30.5 with MCV/RDW 76.6/14.5.  Appears to have been an acute change from 3 days prior (14/41) but no subsequent labs for review  -07/09/2021: CT A/P: Normal liver, pancreas, spleen, GI tract.  3 cm uterine fibroid  No previous EGD or colonoscopy.   Past Medical History:  Diagnosis Date   Onychomycosis      History reviewed. No pertinent surgical history. Family History  Problem Relation Age of Onset   Heart disease Mother    Diabetes Mother    Diabetes Maternal Grandmother    Social History   Tobacco Use   Smoking status: Never   Smokeless tobacco: Never  Vaping Use   Vaping Use: Never used  Substance Use Topics   Alcohol use: Yes    Comment: occ   Drug use: Never   Current Outpatient Medications  Medication Sig Dispense Refill   Ascorbic Acid (VITAMIN C) 100 MG  tablet Take 100 mg by mouth daily.     BLACK CURRANT SEED OIL PO Take by mouth.     cholecalciferol (VITAMIN D3) 25 MCG (1000 UNIT) tablet Take 1,000 Units by mouth daily.     Elderberry 500 MG CAPS Take by mouth.     ferrous sulfate 325 (65 FE) MG EC tablet Take 325 mg by mouth 3 (three) times daily with meals.     magnesium oxide (MAG-OX) 400 (240 Mg) MG tablet Take 400 mg by mouth daily.     METFORMIN HCL PO Take 1,000 mg by mouth daily.     NON FORMULARY soursop     SYNJARDY 12.01-999 MG TABS Take 1 tablet by mouth daily.     No current facility-administered medications for this visit.   No Known Allergies   Review of Systems: All systems reviewed and negative except where noted in HPI.     Physical Exam:    Wt Readings from Last 3 Encounters:  06/18/22 164 lb 6 oz (74.6 kg)  03/28/22 161 lb (73 kg)  07/10/21 173 lb 8 oz (78.7 kg)    BP 118/66   Pulse 89   Ht '5\' 5"'$  (1.651 m)   Wt 164 lb 6 oz (74.6 kg)   BMI 27.35 kg/m  Constitutional:  Pleasant, in no acute distress. Psychiatric: Normal mood and affect. Behavior is normal. Cardiovascular:  2/6 SEM.  Normal rate, regular rhythm. No edema Pulmonary/chest: Effort normal and breath sounds normal. No wheezing, rales or rhonchi. Abdominal: Soft, nondistended, nontender. Bowel sounds active throughout. There are no masses palpable. No hepatomegaly. Neurological: Alert and oriented to person place and time. Skin: Skin is warm and dry. No rashes noted.   ASSESSMENT AND PLAN;   1) Nausea/Vomiting Discussed potential etiologies for her nausea/vomiting to include atypical reflux (does not have heartburn, regurgitation; no appreciable improvement with trial of PPI), medication ADR (symptoms were better with Synjardy XR vs Synjardy), gastroparesis, anxiety, etc.  Plan to evaluate as below:  - EGD to evaluate for medical/luminal pathology, PUD, gastritis, GOO, erosive esophagitis, etc. - If EGD unrevealing and continued symptoms,  may need to contact her Endocrinologist about possibly getting the XR formulation approved - May need to consider GES  2) Microcytic anemia Labs during hospital admission in 06/2021 with microcytosis.  She reports being told she is anemic since then, but no labs available for review  - Check CBC, iron panel, B12, folate today - If iron deficiency anemia, discussed adding colonoscopy and she agrees  3) Diabetes - Reports significant improvement in A1c since starting therapy - Continue close follow-up with Endocrinologist and PCM  The indications, risks, and benefits of EGD and potentially colonoscopy were explained to the patient in detail. Risks include but are not limited to bleeding, perforation, adverse reaction to medications, and cardiopulmonary compromise. Sequelae include but are not limited to the possibility of surgery, hospitalization, and mortality. The patient verbalized understanding and wished to proceed. All questions answered, referred to scheduler. Further recommendations pending results of the exam.     Lavena Bullion, DO, FACG  06/18/2022, 10:27 AM   Long, Nicki Reaper, PA-C

## 2022-06-18 NOTE — Telephone Encounter (Signed)
Patient rescheduled for procedure for 9/29. Please advise.   Thank you

## 2022-06-18 NOTE — Patient Instructions (Addendum)
If you are age 41 or younger, your body mass index should be between 19-25. Your Body mass index is 27.35 kg/m. If this is out of the aformentioned range listed, please consider follow up with your Primary Care Provider.   __________________________________________________________  The [facility] providers would like to encourage you to use [facility] to communicate with providers for non-urgent requests or questions.  Due to long hold times on the telephone, sending your provider a message by [facility] may be a faster and more efficient way to get a response.  Please allow 48 business hours for a response.  Please remember that this is for non-urgent requests.   Due to recent changes in healthcare laws, you may see the results of your imaging and laboratory studies on MyChart before your provider has had a chance to review them.  We understand that in some cases there may be results that are confusing or concerning to you. Not all laboratory results come back in the same time frame and the provider may be waiting for multiple results in order to interpret others.  Please give [place] 48 hours in order for your provider to thoroughly review all the results before contacting the office for clarification of your results.   Your provider has requested that you go to the basement level for lab work before leaving today. Press "B" on the elevator. The lab is located at the first door on the left as you exit the elevator.  You have been scheduled for an endoscopy. Please follow written instructions given to you at your visit today. If you use inhalers (even only as needed), please bring them with you on the day of your procedure.   Thank you for choosing me and [facility].  Vito Cirigliano, D.O.

## 2022-06-21 ENCOUNTER — Encounter: Payer: Self-pay | Admitting: Certified Registered Nurse Anesthetist

## 2022-06-22 ENCOUNTER — Other Ambulatory Visit: Payer: Self-pay

## 2022-06-22 ENCOUNTER — Encounter: Payer: Self-pay | Admitting: Gastroenterology

## 2022-06-22 ENCOUNTER — Ambulatory Visit (AMBULATORY_SURGERY_CENTER): Payer: BC Managed Care – PPO | Admitting: Gastroenterology

## 2022-06-22 ENCOUNTER — Other Ambulatory Visit: Payer: Self-pay | Admitting: Gastroenterology

## 2022-06-22 VITALS — BP 130/76 | HR 80 | Temp 99.6°F | Resp 17 | Ht 65.0 in | Wt 164.0 lb

## 2022-06-22 DIAGNOSIS — D509 Iron deficiency anemia, unspecified: Secondary | ICD-10-CM | POA: Diagnosis not present

## 2022-06-22 DIAGNOSIS — R112 Nausea with vomiting, unspecified: Secondary | ICD-10-CM | POA: Diagnosis present

## 2022-06-22 MED ORDER — FERROUS SULFATE 325 (65 FE) MG PO TABS: 325.0000 mg | ORAL_TABLET | Freq: Two times a day (BID) | ORAL | 2 refills | Status: DC

## 2022-06-22 MED ORDER — SODIUM CHLORIDE 0.9 % IV SOLN: 500.0000 mL | Freq: Once | INTRAVENOUS | Status: DC

## 2022-06-22 NOTE — Progress Notes (Signed)
GASTROENTEROLOGY PROCEDURE H&P NOTE   Primary Care Physician: Shanon Rosser, PA-C    Reason for Procedure:   Nausea/vomiting, IDA  Plan:    EGD  Patient is appropriate for endoscopic procedure(s) in the ambulatory ([facility]) setting.  The nature of the procedure, as well as the risks, benefits, and alternatives were carefully and thoroughly reviewed with the patient. Ample time for discussion and questions allowed. The patient understood, was satisfied, and agreed to proceed.     HPI: [name] is a 41 y.o. female who presents for EGD for evaluation of nausea/vomiting and iron deficiency anemia.  Patient was most recently seen in the Gastroenterology Clinic on 06/18/2022 by me.  No interval change in medical history since that appointment. Please refer to that note for full details regarding GI history and clinical presentation.   Past Medical History:  Diagnosis Date   Onychomycosis     History reviewed. No pertinent surgical history.  Prior to Admission medications   Medication Sig Start Date End Date Taking? Authorizing Provider  Ascorbic Acid (VITAMIN C) 100 MG tablet Take 100 mg by mouth daily.   Yes [provider]  BLACK CURRANT SEED OIL PO Take by mouth.   Yes [provider]  cholecalciferol (VITAMIN D3) 25 MCG (1000 UNIT) tablet Take 1,000 Units by mouth daily.   Yes [provider]  Elderberry 500 MG CAPS Take by mouth.   Yes [provider]  ferrous sulfate 325 (65 FE) MG EC tablet Take 325 mg by mouth 3 (three) times daily with meals.   Yes [provider]  magnesium oxide (MAG-OX) 400 (240 Mg) MG tablet Take 400 mg by mouth daily.   Yes [provider]  METFORMIN HCL PO Take 1,000 mg by mouth daily.   Yes [provider]  NON FORMULARY soursop   Yes [provider]  SYNJARDY 12.01-999 MG TABS Take 1 tablet by mouth daily. 03/05/22  Yes [provider]  atorvastatin (LIPITOR) 10 MG tablet  Take 10 mg by mouth daily. 05/18/22   [provider]  spironolactone (ALDACTONE) 50 MG tablet Take 50 mg by mouth daily. 05/23/22   [provider]    Current Outpatient Medications  Medication Sig Dispense Refill   Ascorbic Acid (VITAMIN C) 100 MG tablet Take 100 mg by mouth daily.     BLACK CURRANT SEED OIL PO Take by mouth.     cholecalciferol (VITAMIN D3) 25 MCG (1000 UNIT) tablet Take 1,000 Units by mouth daily.     Elderberry 500 MG CAPS Take by mouth.     ferrous sulfate 325 (65 FE) MG EC tablet Take 325 mg by mouth 3 (three) times daily with meals.     magnesium oxide (MAG-OX) 400 (240 Mg) MG tablet Take 400 mg by mouth daily.     METFORMIN HCL PO Take 1,000 mg by mouth daily.     NON FORMULARY soursop     SYNJARDY 12.01-999 MG TABS Take 1 tablet by mouth daily.     atorvastatin (LIPITOR) 10 MG tablet Take 10 mg by mouth daily.     spironolactone (ALDACTONE) 50 MG tablet Take 50 mg by mouth daily.     Current Facility-Administered Medications  Medication Dose Route Frequency Provider Last Rate Last Admin   0.9 %  sodium chloride infusion  500 mL Intravenous Once Arlanda Shiplett V, DO        Allergies as of 06/22/2022   (No Known Allergies)  Family History  Problem Relation Age of Onset   Heart disease Mother    Diabetes Mother    Diabetes Maternal Grandmother     Social History   Socioeconomic History   Marital status: Single    Spouse name: Not on file   Number of children: 1   Years of education: Not on file   Highest education level: Not on file  Occupational History   Not on file  Tobacco Use   Smoking status: Never   Smokeless tobacco: Never  Vaping Use   Vaping Use: Never used  Substance and Sexual Activity   Alcohol use: Yes    Comment: occ   Drug use: Never   Sexual activity: Not on file  Other Topics Concern   Not on file  Social History Narrative   Not on file   Social Determinants of Health   Financial Resource  Strain: Not on file  Food Insecurity: Not on file  Transportation Needs: Not on file  Physical Activity: Not on file  Stress: Not on file  Social Connections: Not on file  Intimate Partner Violence: Not on file    Physical Exam: Vital signs in last 24 hours: '@BP'$  (!) 140/77   Pulse 97   Temp 99.6 F (37.6 C) (Temporal)   Ht '5\' 5"'$  (1.651 m)   Wt 164 lb (74.4 kg)   SpO2 100%   BMI 27.29 kg/m  GEN: NAD EYE: Sclerae anicteric ENT: MMM CV: Non-tachycardic Pulm: CTA b/l GI: Soft, NT/ND NEURO:  Alert & Oriented x Stevens, DO [facility]   06/22/2022 2:42 PM

## 2022-06-22 NOTE — Progress Notes (Signed)
VS completed by DT.  Pt's states no medical or surgical changes since previsit or office visit.  

## 2022-06-22 NOTE — Op Note (Signed)
[facility] Patient Name: [name] Procedure Date: 06/22/2022 2:39 PM MRN: [id number] Endoscopist: Gerrit Heck , MD Age: 41 Referring MD:  Date of Birth: 1981-04-19 Gender: Female Account #: [id number] Procedure:                Upper GI endoscopy Indications:              Iron deficiency anemia, Nausea with vomiting Medicines:                Monitored Anesthesia Care Procedure:                Pre-Anesthesia Assessment:                           - Prior to the procedure, a History and Physical                            was performed, and patient medications and                            allergies were reviewed. The patient's tolerance of                            previous anesthesia was also reviewed. The risks                            and benefits of the procedure and the sedation                            options and risks were discussed with the patient.                            All questions were answered, and informed consent                            was obtained. Prior Anticoagulants: The patient has                            taken no previous anticoagulant or antiplatelet                            agents. ASA Grade Assessment: II - A patient with                            mild systemic disease. After reviewing the risks                            and benefits, the patient was deemed in                            satisfactory condition to undergo the procedure.                           After obtaining informed consent, the endoscope was  passed under direct vision. Throughout the                            procedure, the patient's blood pressure, pulse, and                            oxygen saturations were monitored continuously. The                            Endoscope was introduced through the mouth, and                            advanced to the fourth part of duodenum. The upper                            GI endoscopy  was accomplished without difficulty.                            The patient tolerated the procedure well. Scope In: Scope Out: Findings:                 The examined esophagus was normal.                           The Z-line was regular and was found 38 cm from the                            incisors.                           The entire examined stomach was normal. Biopsies                            were taken with a cold forceps for Helicobacter                            pylori testing. Estimated blood loss was minimal.                           The examined duodenum was normal. Biopsies for                            histology were taken with a cold forceps for                            evaluation of celiac disease. Estimated blood loss                            was minimal. Complications:            No immediate complications. Estimated Blood Loss:     Estimated blood loss was minimal. Impression:               - Normal esophagus.                           -  Z-line regular, 38 cm from the incisors.                           - Normal stomach. Biopsied.                           - Normal examined duodenum. Biopsied. Recommendation:           - Patient has a contact number available for                            emergencies. The signs and symptoms of potential                            delayed complications were discussed with the                            patient. Return to normal activities tomorrow.                            Written discharge instructions were provided to the                            patient.                           - Resume previous diet.                           - Continue present medications.                           - Await pathology results.                           - Perform a colonoscopy at appointment to be                            scheduled.                           - If biopsies from today's procedure and the                             colonoscopy are otherwise unrevealing, plan for                            Video Capsule Endoscopy for further evaluation of                            iron deficiency anemia. Gerrit Heck, MD 06/22/2022 3:07:06 PM

## 2022-06-22 NOTE — Progress Notes (Signed)
Report given to PACU, vss 

## 2022-06-22 NOTE — Progress Notes (Signed)
Dr Bryan Lemma stated that his nurse Mickel Baas would make all of the appointments for this patient.

## 2022-06-22 NOTE — Progress Notes (Signed)
1453 Robinul 0.1 mg IV given due large amount of secretions upon assessment.  MD made aware, vss

## 2022-06-22 NOTE — Progress Notes (Signed)
Called to room to assist during endoscopic procedure.  Patient ID and intended procedure confirmed with present staff. Received instructions for my participation in the procedure from the performing physician.  

## 2022-06-22 NOTE — Patient Instructions (Signed)
Read all of the handouts given to you by your recovery room nurse.  Dr Vivia Ewing nurse will schedule the colonoscopy for you.  She will be calling you.  Read all handouts given to you by your recovery room nurse.  YOU HAD AN ENDOSCOPIC PROCEDURE TODAY AT [facility]:   Refer to the procedure report that was given to you for any specific questions about what was found during the examination.  If the procedure report does not answer your questions, please call your gastroenterologist to clarify.  If you requested that your care partner not be given the details of your procedure findings, then the procedure report has been included in a sealed envelope for you to review at your convenience later.  YOU SHOULD EXPECT: Some feelings of bloating in the abdomen. Passage of more gas than usual.  Walking can help get rid of the air that was put into your GI tract during the procedure and reduce the bloating.   Please Note:  You might notice some irritation and congestion in your nose or some drainage.  This is from the oxygen used during your procedure.  There is no need for concern and it should clear up in a day or so.  SYMPTOMS TO REPORT IMMEDIATELY:  Following upper endoscopy (EGD)  Vomiting of blood or coffee ground material  New chest pain or pain under the shoulder blades  Painful or persistently difficult swallowing  New shortness of breath  Fever of 100F or higher  Black, tarry-looking stools  For urgent or emergent issues, a gastroenterologist can be reached at any hour by calling [phone]. Do not use MyChart messaging for urgent concerns.    DIET:  We do recommend a small meal at first, but then you may proceed to your regular diet.  Drink plenty of fluids but you should avoid alcoholic beverages for 24 hours.  ACTIVITY:  You should plan to take it easy for the rest of today and you should NOT DRIVE or use heavy machinery until tomorrow (because of the sedation  medicines used during the test).    FOLLOW UP: Our staff will call the number listed on your records the next business day following your procedure.  We will call around 7:15- 8:00 am to check on you and address any questions or concerns that you may have regarding the information given to you following your procedure. If we do not reach you, we will leave a message.     If any biopsies were taken you will be contacted by phone or by letter within the next 1-3 weeks.  Please call us at [phone] if you have not heard about the biopsies in 3 weeks.    SIGNATURES/CONFIDENTIALITY: You and/or your care partner have signed paperwork which will be entered into your electronic medical record.  These signatures attest to the fact that that the information above on your After Visit Summary has been reviewed and is understood.  Full responsibility of the confidentiality of this discharge information lies with you and/or your care-partner.

## 2022-06-25 ENCOUNTER — Telehealth: Payer: Self-pay

## 2022-06-25 ENCOUNTER — Other Ambulatory Visit: Payer: Self-pay

## 2022-06-25 DIAGNOSIS — D509 Iron deficiency anemia, unspecified: Secondary | ICD-10-CM

## 2022-06-25 MED ORDER — NA SULFATE-K SULFATE-MG SULF 17.5-3.13-1.6 GM/177ML PO SOLN: 1.0000 | Freq: Once | ORAL | 0 refills | Status: AC

## 2022-06-25 NOTE — Telephone Encounter (Signed)
-----   Message from [place], DO sent at 06/22/2022  3:14 PM EDT ----- EGD was essentially normal. Can go ahead and call her on Monday to schedule Colo with me, next available, for continued w/u of IDA. Thanks!

## 2022-06-25 NOTE — Telephone Encounter (Signed)
Pt scheduled for colonoscopy on 11/15 at 9:30 am with Dr. Bryan Lemma. Ambulatory referral placed and prep sent to pt's pharmacy. Instructions mailed. Pt verbalized understanding and had no other concerns at end of call.

## 2022-06-25 NOTE — Telephone Encounter (Signed)
  Follow up Call-     06/22/2022    1:45 PM  Call back number  Post procedure Call Back phone  # [phone]  Permission to leave phone message Yes     Patient questions:  Do you have a fever, pain , or abdominal swelling? No. Pain Score  0 *  Have you tolerated food without any problems? Yes.    Have you been able to return to your normal activities? Yes.    Do you have any questions about your discharge instructions: Diet   No. Medications  No. Follow up visit  No.  Do you have questions or concerns about your Care? No.  Actions: * If pain score is 4 or above: No action needed, pain <4.

## 2022-06-29 ENCOUNTER — Encounter: Payer: Self-pay | Admitting: Gastroenterology

## 2022-07-02 ENCOUNTER — Encounter: Payer: Medicaid Other | Admitting: Gastroenterology

## 2022-07-12 ENCOUNTER — Other Ambulatory Visit: Payer: Self-pay | Admitting: Gastroenterology

## 2022-07-18 NOTE — Telephone Encounter (Signed)
Instructions mailed were returned to [place] as undeliverable.  Tried calling to get an updated address, no answer, no voicemail.

## 2022-07-20 NOTE — Telephone Encounter (Addendum)
Spoke with pt and pt stated that her address is correct and it was marked as undeliverable because her mailbox is full. Pt stated we could resend to her address. Instructions mailed to [place] [id number] per pt request.

## 2022-07-24 ENCOUNTER — Inpatient Hospital Stay (HOSPITAL_COMMUNITY)
Admission: AD | Admit: 2022-07-24 | Discharge: 2022-07-24 | Disposition: A | Discharge status: Home / Self Care | Payer: BC Managed Care – PPO | Attending: Obstetrics and Gynecology | Admitting: Obstetrics and Gynecology

## 2022-07-24 ENCOUNTER — Encounter (HOSPITAL_COMMUNITY): Payer: Self-pay | Admitting: Obstetrics and Gynecology

## 2022-07-24 ENCOUNTER — Other Ambulatory Visit: Payer: Self-pay

## 2022-07-24 ENCOUNTER — Inpatient Hospital Stay (HOSPITAL_COMMUNITY): Payer: BC Managed Care – PPO

## 2022-07-24 DIAGNOSIS — O209 Hemorrhage in early pregnancy, unspecified: Secondary | ICD-10-CM | POA: Diagnosis present

## 2022-07-24 DIAGNOSIS — O00102 Left tubal pregnancy without intrauterine pregnancy: Secondary | ICD-10-CM | POA: Insufficient documentation

## 2022-07-24 HISTORY — DX: Anemia, unspecified: D64.9

## 2022-07-24 HISTORY — DX: Anxiety disorder, unspecified: F41.9

## 2022-07-24 HISTORY — DX: Type 2 diabetes mellitus without complications: E11.9

## 2022-07-24 LAB — CBC WITH DIFFERENTIAL/PLATELET
Abs Immature Granulocytes: 0.06 10*3/uL (ref 0.00–0.07)
Basophils Absolute: 0.1 10*3/uL (ref 0.0–0.1)
Basophils Relative: 1 %
Eosinophils Absolute: 0.1 10*3/uL (ref 0.0–0.5)
Eosinophils Relative: 1 %
HCT: 31.7 % — ABNORMAL LOW (ref 36.0–46.0)
Hemoglobin: 10.1 g/dL — ABNORMAL LOW (ref 12.0–15.0)
Immature Granulocytes: 0 %
Lymphocytes Relative: 18 %
Lymphs Abs: 2.6 10*3/uL (ref 0.7–4.0)
MCH: 25.3 pg — ABNORMAL LOW (ref 26.0–34.0)
MCHC: 31.9 g/dL (ref 30.0–36.0)
MCV: 79.4 fL — ABNORMAL LOW (ref 80.0–100.0)
Monocytes Absolute: 0.6 10*3/uL (ref 0.1–1.0)
Monocytes Relative: 4 %
Neutro Abs: 10.8 10*3/uL — ABNORMAL HIGH (ref 1.7–7.7)
Neutrophils Relative %: 76 %
Platelets: 390 10*3/uL (ref 150–400)
RBC: 3.99 MIL/uL (ref 3.87–5.11)
Smear Review: NORMAL
WBC: 14.2 10*3/uL — ABNORMAL HIGH (ref 4.0–10.5)
nRBC: 0 % (ref 0.0–0.2)

## 2022-07-24 LAB — WET PREP, GENITAL
Clue Cells Wet Prep HPF POC: NONE SEEN
Sperm: NONE SEEN
Trich, Wet Prep: NONE SEEN
WBC, Wet Prep HPF POC: >=10 — AB (ref <10)
Yeast Wet Prep HPF POC: NONE SEEN

## 2022-07-24 LAB — COMPREHENSIVE METABOLIC PANEL
ALT: 14 U/L (ref 0–44)
AST: 15 U/L (ref 15–41)
Albumin: 4 g/dL (ref 3.5–5.0)
Alkaline Phosphatase: 44 U/L (ref 38–126)
Anion gap: 11 (ref 5–15)
BUN: 16 mg/dL (ref 6–20)
CO2: 23 mmol/L (ref 22–32)
Calcium: 9.9 mg/dL (ref 8.9–10.3)
Chloride: 104 mmol/L (ref 98–111)
Creatinine, Ser: 0.7 mg/dL (ref 0.44–1.00)
GFR, Estimated: >60 mL/min (ref >60)
Glucose, Bld: 107 mg/dL — ABNORMAL HIGH (ref 70–99)
Potassium: 4.1 mmol/L (ref 3.5–5.1)
Sodium: 138 mmol/L (ref 135–145)
Total Bilirubin: 0.5 mg/dL (ref 0.3–1.2)
Total Protein: 7.2 g/dL (ref 6.5–8.1)

## 2022-07-24 LAB — HIV ANTIBODY (ROUTINE TESTING W REFLEX): HIV Screen 4th Generation wRfx: NONREACTIVE

## 2022-07-24 LAB — I-STAT BETA HCG BLOOD, ED (MC, WL, AP ONLY): I-stat hCG, quantitative: 651.6 m[IU]/mL — ABNORMAL HIGH (ref <5)

## 2022-07-24 MED ORDER — RHO D IMMUNE GLOBULIN 1500 UNIT/2ML IJ SOSY
300.0000 ug | PREFILLED_SYRINGE | Freq: Once | INTRAMUSCULAR | Status: AC
  Administered 2022-07-24: 300 ug via INTRAMUSCULAR
  Filled 2022-07-24: qty 2

## 2022-07-24 MED ORDER — PROMETHAZINE HCL 25 MG PO TABS: 25.0000 mg | ORAL_TABLET | Freq: Four times a day (QID) | ORAL | 1 refills | Status: DC | PRN

## 2022-07-24 MED ORDER — ACETAMINOPHEN 500 MG PO TABS
1000.0000 mg | ORAL_TABLET | Freq: Once | ORAL | Status: AC
  Administered 2022-07-24: 1000 mg via ORAL
  Filled 2022-07-24: qty 2

## 2022-07-24 MED ORDER — TRAMADOL HCL 50 MG PO TABS: 50.0000 mg | ORAL_TABLET | Freq: Four times a day (QID) | ORAL | 0 refills | Status: DC | PRN

## 2022-07-24 MED ORDER — METHOTREXATE FOR ECTOPIC PREGNANCY
50.0000 mg/m2 | Freq: Once | INTRAMUSCULAR | Status: AC
  Administered 2022-07-24: 90 mg via INTRAMUSCULAR
  Filled 2022-07-24: qty 10

## 2022-07-24 MED ORDER — PROMETHAZINE HCL 25 MG PO TABS
25.0000 mg | ORAL_TABLET | Freq: Once | ORAL | Status: AC
  Administered 2022-07-24: 25 mg via ORAL
  Filled 2022-07-24: qty 1

## 2022-07-24 NOTE — ED Provider Triage Note (Signed)
Emergency Medicine Provider Triage Evaluation Note  [name] , a 41 y.o. female  was evaluated in triage.  Pt complains of very heavy vaginal bleeding for about 12 days, occasional intermittent vomiting.  She has a history of fibroid diagnosed on CT 06/2021.  No lightheadedness or syncope.  States she passed a large clot this morning.  Review of Systems  Positive: Vaginal bleeding, left lower quadrant abdominal pain Negative: Syncope  Physical Exam  BP 134/80 (BP Location: Right Arm)   Pulse 98   Temp 98.6 F (37 C)   Resp 17   SpO2 100%  Gen:   Awake, no distress   Resp:  Normal effort  MSK:   Moves extremities without difficulty  Other:  LLQ tenderness  Medical Decision Making  Medically screening exam initiated at 10:55 AM.  Appropriate orders placed.  Encarnacion Chu was informed that the remainder of the evaluation will be completed by another provider, this initial triage assessment does not replace that evaluation, and the importance of remaining in the ED until their evaluation is complete.     Carlisle Cater, PA-C 07/24/22 1055

## 2022-07-24 NOTE — ED Triage Notes (Addendum)
Pt. Stated, ive been bleeding vaginally for 12 days. I do have a fibroid and Im having a sharp pain on the left side. Im susung 6 big pads a day and its usually when Im standing.

## 2022-07-24 NOTE — MAU Provider Note (Signed)
Chief Complaint: Vaginal Bleeding   Event Date/Time   First Provider Initiated Contact with Patient 07/24/22 1529     SUBJECTIVE HPI: [name] is a 41 y.o. G3P1011 at 44w2dby LMP who was sent to [facility] Admissions from ED for vaginal bleeding, LLQ pain and N/V since 07/18/22 and passing large clot today. Came to ED for passing large clot and was found to be pregnant.    Vaginal Bleeding: Moderate Passage of tissue or clots: passed large clot. Denies passage of tissue.  Dizziness: Denies  O NEG  Pain Location: LLQ Quality: sharp Severity: 5/10 on pain scale now. Severe pain 07/18/22. Duration: 6 days Course: Pain has improved Context: ealry pregnancy. Didn't know she was pregnant. IUP not verified.  Timing: intermittent Modifying factors: There are no aggravating or aleviating factors. Associated signs and symptoms: Neg for fever, chills.   Past Medical History:  Diagnosis Date   Anemia    Anxiety    Diabetes mellitus without complication [place]    Onychomycosis    OB History  Gravida Para Term Preterm AB Living  '3 1 1   1 1  '$ SAB IAB Ectopic Multiple Live Births    1     1    # Outcome Date GA Lbr Len/2nd Weight Sex Delivery Anes PTL Lv  3 Current           2 Term 12/25/04    F Vag-Spont   LIV  1 IAB            Past Surgical History:  Procedure Laterality Date   NO PAST SURGERIES     Social History   Socioeconomic History   Marital status: Single    Spouse name: Not on file   Number of children: 1   Years of education: Not on file   Highest education level: Not on file  Occupational History   Not on file  Tobacco Use   Smoking status: Never   Smokeless tobacco: Never  Vaping Use   Vaping Use: Never used  Substance and Sexual Activity   Alcohol use: Yes    Comment: occ   Drug use: Never   Sexual activity: Yes  Other Topics Concern   Not on file  Social History Narrative   Not on file   Social Determinants of Health   Financial Resource  Strain: Not on file  Food Insecurity: Not on file  Transportation Needs: Not on file  Physical Activity: Not on file  Stress: Not on file  Social Connections: Not on file  Intimate Partner Violence: Not on file   No current facility-administered medications on file prior to encounter.   Current Outpatient Medications on File Prior to Encounter  Medication Sig Dispense Refill   Ascorbic Acid (VITAMIN C) 100 MG tablet Take 100 mg by mouth daily.     BLACK CURRANT SEED OIL PO Take by mouth.     cholecalciferol (VITAMIN D3) 25 MCG (1000 UNIT) tablet Take 1,000 Units by mouth daily.     Elderberry 500 MG CAPS Take by mouth.     ferrous sulfate 325 (65 FE) MG tablet TAKE 1 TABLET BY MOUTH TWICE A DAY 180 tablet 1   magnesium gluconate (MAGONATE) 500 MG tablet Take 500 mg by mouth daily.     METFORMIN HCL PO Take 1,000 mg by mouth daily.     SYNJARDY 12.01-999 MG TABS Take 1 tablet by mouth daily.     atorvastatin (LIPITOR) 10 MG tablet Take  10 mg by mouth daily.     magnesium oxide (MAG-OX) 400 (240 Mg) MG tablet Take 400 mg by mouth daily.     NON FORMULARY soursop     spironolactone (ALDACTONE) 50 MG tablet Take 50 mg by mouth daily.     No Known Allergies  I have reviewed the past Medical Hx, Surgical Hx, Social Hx, Allergies and Medications.   Review of Systems  Constitutional:  Negative for chills and fever.  Gastrointestinal:  Positive for abdominal pain and nausea. Negative for vomiting.  Genitourinary:  Positive for vaginal bleeding. Negative for dysuria.  Neurological:  Negative for dizziness.    OBJECTIVE Patient Vitals for the past 24 hrs:  BP Temp Pulse Resp SpO2 Height Weight  07/24/22 1514 133/76 -- (!) 105 -- -- -- --  07/24/22 1428 (!) 140/72 -- (!) 102 -- 100 % '5\' 5"'$  (1.651 m) 71 kg  07/24/22 1055 -- -- -- -- -- '5\' 5"'$  (1.651 m) 73.9 kg  07/24/22 1010 134/80 98.6 F (37 C) 98 17 100 % -- --   Constitutional: Well-developed, well-nourished female in no acute  distress.  Cardiovascular: Mild tachycardia Respiratory: normal rate and effort.  GI: Abd soft, mild LLQ TTP. No palpable mass.  MS: Extremities nontender, no edema, normal ROM Neurologic: Alert and oriented x 4.  GU: Neg CVAT.  SPECULUM EXAM: NEFG, physiologic discharge, small amount of dark red blood noted and small blood clot, cervix clean, visually closed.  BIMANUAL: cervix closed; uterus normal size, no adnexal tenderness or masses. No CMT.  LAB RESULTS Results for orders placed or performed during the hospital encounter of 07/24/22 (from the past 24 hour(s))  CBC with Differential     Status: Abnormal   Collection Time: 07/24/22 11:25 AM  Result Value Ref Range   WBC 14.2 (H) 4.0 - 10.5 K/uL   RBC 3.99 3.87 - 5.11 MIL/uL   Hemoglobin 10.1 (L) 12.0 - 15.0 g/dL   HCT 31.7 (L) 36.0 - 46.0 %   MCV 79.4 (L) 80.0 - 100.0 fL   MCH 25.3 (L) 26.0 - 34.0 pg   MCHC 31.9 30.0 - 36.0 g/dL   RDW Not Measured 11.5 - 15.5 %   Platelets 390 150 - 400 K/uL   nRBC 0.0 0.0 - 0.2 %   Neutrophils Relative % 76 %   Neutro Abs 10.8 (H) 1.7 - 7.7 K/uL   Lymphocytes Relative 18 %   Lymphs Abs 2.6 0.7 - 4.0 K/uL   Monocytes Relative 4 %   Monocytes Absolute 0.6 0.1 - 1.0 K/uL   Eosinophils Relative 1 %   Eosinophils Absolute 0.1 0.0 - 0.5 K/uL   Basophils Relative 1 %   Basophils Absolute 0.1 0.0 - 0.1 K/uL   WBC Morphology MORPHOLOGY UNREMARKABLE    RBC Morphology See Note    Smear Review Normal platelet morphology    Immature Granulocytes 0 %   Abs Immature Granulocytes 0.06 0.00 - 0.07 K/uL   Acanthocytes PRESENT    Ovalocytes PRESENT   Comprehensive metabolic panel     Status: Abnormal   Collection Time: 07/24/22 11:25 AM  Result Value Ref Range   Sodium 138 135 - 145 mmol/L   Potassium 4.1 3.5 - 5.1 mmol/L   Chloride 104 98 - 111 mmol/L   CO2 23 22 - 32 mmol/L   Glucose, Bld 107 (H) 70 - 99 mg/dL   BUN 16 6 - 20 mg/dL   Creatinine, Ser 0.70 0.44 - 1.00 mg/dL  Calcium 9.9 8.9 -  10.3 mg/dL   Total Protein 7.2 6.5 - 8.1 g/dL   Albumin 4.0 3.5 - 5.0 g/dL   AST 15 15 - 41 U/L   ALT 14 0 - 44 U/L   Alkaline Phosphatase 44 38 - 126 U/L   Total Bilirubin 0.5 0.3 - 1.2 mg/dL   GFR, Estimated >60 >60 mL/min   Anion gap 11 5 - 15  I-Stat beta hCG blood, ED     Status: Abnormal   Collection Time: 07/24/22 11:37 AM  Result Value Ref Range   I-stat hCG, quantitative 651.6 (H) <5 mIU/mL   Comment 3          Rh IG workup (includes ABO/Rh)     Status: None (Preliminary result)   Collection Time: 07/24/22  3:04 PM  Result Value Ref Range   Gestational Age(Wks) 6.2    ABO/RH(D) O NEG    Antibody Screen NEG    Unit Number [id number]    Blood Component Type RHIG    Unit division 00    Status of Unit ISSUED    Transfusion Status      OK TO TRANSFUSE Performed at [facility] [address] [place], [place] [address]   HIV Antibody (routine testing w rflx)     Status: None   Collection Time: 07/24/22  3:04 PM  Result Value Ref Range   HIV Screen 4th Generation wRfx Non Reactive Non Reactive  Wet prep, genital     Status: Abnormal   Collection Time: 07/24/22  4:03 PM   Specimen: PATH Cytology Cervicovaginal Ancillary Only  Result Value Ref Range   Yeast Wet Prep HPF POC NONE SEEN NONE SEEN   Trich, Wet Prep NONE SEEN NONE SEEN   Clue Cells Wet Prep HPF POC NONE SEEN NONE SEEN   WBC, Wet Prep HPF POC >=10 (A) <10   Sperm NONE SEEN     IMAGING US OB LESS THAN 14 WEEKS WITH OB TRANSVAGINAL  Result Date: 07/24/2022 CLINICAL DATA:  First trimester pregnancy, abdominal pain. EXAM: OBSTETRIC <14 WK [place] AND TRANSVAGINAL OB US TECHNIQUE: Both transabdominal and transvaginal ultrasound examinations were performed for complete evaluation of the gestation as well as the maternal uterus, adnexal regions, and pelvic cul-de-sac. Transvaginal technique was performed to assess early pregnancy. COMPARISON:  September 22, 2013. FINDINGS: Intrauterine gestational sac: None  Yolk sac:  Not Visualized. Embryo:  Not Visualized. Cardiac Activity: Not Visualized. Subchorionic hemorrhage:  None visualized. Maternal uterus/adnexae: Ovaries are unremarkable. However, large complex but predominantly hyperechoic abnormality is noted in the left adnexal region adjacent to the left ovary measuring 3.4 x 2.9 x 2.5 cm. This is highly concerning for ectopic pregnancy and/or hemorrhage related to this. IMPRESSION: No evidence of intrauterine gestation. Large complex but predominantly hyperechoic abnormality is noted in the left adnexal region adjacent to left ovary highly concerning for ectopic pregnancy and/or associated hemorrhage. Critical Value/emergent results were called by telephone at the time of interpretation on 07/24/2022 at 5:13 pm to provider [facility] , who verbally acknowledged these results. Electronically Signed   By: Marijo Conception M.D.   On: 07/24/2022 17:13    MAU COURSE/MDM CBC, Quant, ABO/Rh, ultrasound, wet prep and GC/chlamydia culture, UA.  US show 3.4 cm left adnexal mass and no IUP. Discussed Hx, labs, exam w/ Dr. Rip Harbour. New orders: methotrexate for ectopic pregnancy. Dicussed US findings with pt and spouse. Detailed discussion of concern for ectopic pregnancy, R/B/I of MTX  Tx vs surgery. Pt is good candidate for MTX and agrees to administation.    Methotrexate Treatment Protocol for Ectopic Pregnancy  X Pretreatment testing and instructions  X hCG concentration  X Transvaginal ultrasound  X Blood group and Rh(D) typing; give Rhogam 300 mcg IM given X Complete blood count  X Liver and renal function tests Nml X Discontinue folic acid supplements  X Counsel patient to avoid NSAIDs, recommend acetaminophen if an analgesic is needed  X Advise patient to refrain from sexual intercourse and strenuous exercise  Treatment day  Single dose protocol   1  hCG.  Administer Methotrexate 50 mg/m2 body surface area IM  4  hCG  7  hCG  If <15 percent hCG decline  from day 4 to 7, give additional dose of methotrexate 50 mg/m2 IM  If ?15 percent hCG decline from day 4 to 7, draw hCG weekly until undetectable  14  hCG  If <15 percent hCG decline from day 7 to 14, give additional dose of methotrexate 50 mg/m2 IM  If ?15 percent hCG decline from day 7 to 14, check hCG weekly until undetectable  21 and 28  If 3 doses have been given and there is a <15 percent hCG decline from day 21 to 28, proceed with laparoscopic surgery     The risks of methotrexate were reviewed including failure requiring repeat dosing or eventual surgery. She understands that methotrexate involves frequent return visits to monitor lab values and that she remains at risk of ectopic rupture until her beta is less than assay. ?The patient opts to proceed with methotrexate.  She has no history of hepatic or renal dysfunction, has normal BUN/Cr/LFT's/platelets.  She is felt to be reliable for follow-up. Side effects of photosensitivity & GI upset were discussed.  She knows to avoid direct sunlight and abstain from alcohol, NSAIDs and sexual intercourse for two weeks. She was counseled to discontinue any MVI with folic acid. ?She understands to follow up on D4 (11/3) and D7 (11/6) for repeat BHCG and was given the instruction sheet. ?Strict ectopic precautions were reviewed, the patient knows to call with any abdominal pain, vomiting, fainting, or any concerns with her health.  Day 0/1 Day 4 Day 7  Sunday Wednesday Saturday  Monday Thursday Sunday  Tuesday Friday Monday  Wednesday Saturday Tuesday  Thursday Sunday Wednesday  Friday Monday Thursday  Saturday Tuesday Friday     ASSESSMENT No diagnosis found. 1. Left tubal pregnancy without intrauterine pregnancy      PLAN Discharge home in stable condition. Ectopic precautions Support given. Offered IBH. Allergies as of 07/24/2022   No Known Allergies      Medication List     STOP taking these medications    atorvastatin 10  MG tablet Commonly known as: LIPITOR   magnesium oxide 400 (240 Mg) MG tablet Commonly known as: MAG-OX   spironolactone 50 MG tablet Commonly known as: ALDACTONE   Synjardy 12.01-999 MG Tabs Generic drug: Empagliflozin-metFORMIN HCl       TAKE these medications    BLACK CURRANT SEED OIL PO Take by mouth.   cholecalciferol 25 MCG (1000 UNIT) tablet Commonly known as: VITAMIN D3 Take 1,000 Units by mouth daily.   Elderberry 500 MG Caps Take by mouth.   ferrous sulfate 325 (65 FE) MG tablet TAKE 1 TABLET BY MOUTH TWICE A DAY   magnesium gluconate 500 MG tablet Commonly known as: MAGONATE Take 500 mg by mouth daily.   NON FORMULARY  soursop   promethazine 25 MG tablet Commonly known as: PHENERGAN Take 1 tablet (25 mg total) by mouth every 6 (six) hours as needed for nausea or vomiting.   traMADol 50 MG tablet Commonly known as: ULTRAM Take 1-2 tablets (50-100 mg total) by mouth every 6 (six) hours as needed.   vitamin C 100 MG tablet Take 100 mg by mouth daily.         Tamala Julian, [place], [place] 07/24/2022  5:46 PM  4

## 2022-07-24 NOTE — MAU Note (Signed)
[name] is a 41 y.o. at Unknown here in MAU reporting: started passing the clots when she was throwing up today.  Has been bleeding for 12 days.  Did not know she was pregnant, they did not tell her she was pregant.  Thought this was a period, started when she expected.  Her gastro- told her she had a fibroid. Has not been using birth control.  Passed a clot this morning that was bigger than her hand, that is why she came to the hosp, has passed som small clots. Foul  odor LMP: 09/17 Onset of complaint: bleeding started 12days Pain score: 4/5 Vitals:   07/24/22 1010  BP: 134/80  Pulse: 98  Resp: 17  Temp: 98.6 F (37 C)  SpO2: 100%      Lab orders placed from triage:  none

## 2022-07-25 LAB — GC/CHLAMYDIA PROBE AMP (~~LOC~~) NOT AT ARMC
Chlamydia: NEGATIVE
Comment: NEGATIVE
Comment: NORMAL
Neisseria Gonorrhea: NEGATIVE

## 2022-07-25 LAB — RH IG WORKUP (INCLUDES ABO/RH)
ABO/RH(D): O NEG
Antibody Screen: NEGATIVE
Gestational Age(Wks): 6.2
Unit division: 0

## 2022-07-26 ENCOUNTER — Other Ambulatory Visit: Payer: Self-pay | Admitting: *Deleted

## 2022-07-26 DIAGNOSIS — O00102 Left tubal pregnancy without intrauterine pregnancy: Secondary | ICD-10-CM

## 2022-07-26 NOTE — Progress Notes (Signed)
Opened in error

## 2022-07-27 ENCOUNTER — Ambulatory Visit (INDEPENDENT_AMBULATORY_CARE_PROVIDER_SITE_OTHER): Payer: BC Managed Care – PPO | Admitting: *Deleted

## 2022-07-27 VITALS — BP 126/69 | HR 93 | Ht 65.0 in | Wt 156.5 lb

## 2022-07-27 DIAGNOSIS — O00102 Left tubal pregnancy without intrauterine pregnancy: Secondary | ICD-10-CM

## 2022-07-27 LAB — BETA HCG QUANT (REF LAB): hCG Quant: 399 m[IU]/mL

## 2022-07-27 NOTE — Progress Notes (Signed)
Pt presents for stat BHCG Grettel Rames #4 MTX. She reports still having pain - primarily on Lt side. Pain scale - 4.  She also is still bleeding however is less than when seen on 10/31 and no clots. Pt is changing a pad every 3 hours and is not saturated. She was advised that she will be called with results today and next steps in care. She stated that a detailed message can be left on voicemail if she does not answer. Pt is aware that if hormone level is decreasing appropriately, she will return as scheduled on 11/6 for repeat BHCG.level. If she develops heavy vaginal bleeding or increased abdominal pain, she should return to MAU for evaluation.   1210  BHCG result (399) reviewed by Dr. Dione Plover who finds appropriate decrease. Pt was called and informed of results. Continue with established plan of care to return to office on 11/6 for next stat BHCG.  Pt voiced understanding and had no questions.

## 2022-07-30 ENCOUNTER — Ambulatory Visit (INDEPENDENT_AMBULATORY_CARE_PROVIDER_SITE_OTHER): Payer: BC Managed Care – PPO | Admitting: *Deleted

## 2022-07-30 VITALS — BP 123/77 | HR 88 | Ht 65.0 in | Wt 160.4 lb

## 2022-07-30 DIAGNOSIS — O00102 Left tubal pregnancy without intrauterine pregnancy: Secondary | ICD-10-CM

## 2022-07-30 LAB — BETA HCG QUANT (REF LAB): hCG Quant: 230 m[IU]/mL

## 2022-07-30 NOTE — Progress Notes (Signed)
Here for stat bhcg. Denies pain at present. States having cramping last night, pain relieved by tramadol. States still bleeding like a period. Blood drawn by lab and explained we will call with results and plan of care in a few hours. She voices understanding. Mariapaula Krist,RN 1:25pm. Results received =230 and reviewed with Dr. Harolyn Rutherford. She confirms bhcg dropping and recommends weekly non stat bhcg until returns to normal. She also recommends ectopic precautions. I called Sameen and gave her results and recommendations. I scheduled non stat bhcg lab appointment for 08/08/22 per patient preference. She voices understanding.  Staci Acosta

## 2022-08-05 ENCOUNTER — Emergency Department
Admission: EM | Admit: 2022-08-05 | Discharge: 2022-08-05 | Disposition: A | Discharge status: Home / Self Care | Payer: BC Managed Care – PPO | Attending: Emergency Medicine | Admitting: Emergency Medicine

## 2022-08-05 ENCOUNTER — Other Ambulatory Visit: Payer: Self-pay

## 2022-08-05 ENCOUNTER — Emergency Department: Payer: BC Managed Care – PPO

## 2022-08-05 ENCOUNTER — Encounter: Payer: Self-pay | Admitting: Emergency Medicine

## 2022-08-05 DIAGNOSIS — E119 Type 2 diabetes mellitus without complications: Secondary | ICD-10-CM | POA: Insufficient documentation

## 2022-08-05 DIAGNOSIS — Y92410 Unspecified street and highway as the place of occurrence of the external cause: Secondary | ICD-10-CM | POA: Insufficient documentation

## 2022-08-05 DIAGNOSIS — M25562 Pain in left knee: Secondary | ICD-10-CM | POA: Diagnosis not present

## 2022-08-05 DIAGNOSIS — S8002XA Contusion of left knee, initial encounter: Secondary | ICD-10-CM | POA: Insufficient documentation

## 2022-08-05 DIAGNOSIS — S8992XA Unspecified injury of left lower leg, initial encounter: Secondary | ICD-10-CM | POA: Diagnosis present

## 2022-08-05 MED ORDER — ACETAMINOPHEN 325 MG PO TABS
650.0000 mg | ORAL_TABLET | Freq: Once | ORAL | Status: AC
  Administered 2022-08-05: 650 mg via ORAL
  Filled 2022-08-05: qty 2
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
## 2022-08-07 ENCOUNTER — Other Ambulatory Visit: Payer: BC Managed Care – PPO

## 2022-08-07 DIAGNOSIS — O00102 Left tubal pregnancy without intrauterine pregnancy: Secondary | ICD-10-CM

## 2022-08-08 ENCOUNTER — Other Ambulatory Visit: Payer: BC Managed Care – PPO

## 2022-08-08 ENCOUNTER — Telehealth: Payer: Self-pay | Admitting: *Deleted

## 2022-08-08 ENCOUNTER — Encounter: Payer: Medicaid Other | Admitting: Gastroenterology

## 2022-08-08 DIAGNOSIS — O00102 Left tubal pregnancy without intrauterine pregnancy: Secondary | ICD-10-CM

## 2022-08-08 LAB — BETA HCG QUANT (REF LAB): hCG Quant: 52 m[IU]/mL

## 2022-08-08 NOTE — Telephone Encounter (Addendum)
-----   Message from Osborne Oman, MD sent at 08/08/2022  3:08 PM EST ----- Reassuring HCG drop. Continue weekly non stat bhcg until returns to normal. Also recommend ectopic precautions. Please call to inform patient of results and recommendations.  11/15  1520  Called pt and informed her pf test results and need for repeat lab test next week. Pt voiced understanding and agreed to appt on 11/20 @ 0930.

## 2022-08-13 ENCOUNTER — Other Ambulatory Visit: Payer: BC Managed Care – PPO

## 2022-08-13 DIAGNOSIS — O00102 Left tubal pregnancy without intrauterine pregnancy: Secondary | ICD-10-CM

## 2022-08-14 ENCOUNTER — Telehealth: Payer: Self-pay

## 2022-08-14 LAB — BETA HCG QUANT (REF LAB): hCG Quant: 26 m[IU]/mL

## 2022-08-14 NOTE — Telephone Encounter (Addendum)
-----   Message from Osborne Oman, MD sent at 08/14/2022  1:14 PM EST ----- Weekly non stat bhcg until negative  Called pt; VM left stating I am calling with results and pt should call back. Explained pt will need lab visit next week so this needs to be scheduled. Will attempt to contact pt a second time.

## 2022-08-18 ENCOUNTER — Other Ambulatory Visit: Payer: Self-pay

## 2022-08-18 ENCOUNTER — Inpatient Hospital Stay (HOSPITAL_COMMUNITY)
Admission: AD | Admit: 2022-08-18 | Discharge: 2022-08-18 | Disposition: A | Discharge status: Home / Self Care | Payer: BC Managed Care – PPO | Attending: Family Medicine | Admitting: Family Medicine

## 2022-08-18 ENCOUNTER — Encounter (HOSPITAL_COMMUNITY): Payer: Self-pay | Admitting: Family Medicine

## 2022-08-18 DIAGNOSIS — Z7984 Long term (current) use of oral hypoglycemic drugs: Secondary | ICD-10-CM | POA: Diagnosis not present

## 2022-08-18 DIAGNOSIS — N939 Abnormal uterine and vaginal bleeding, unspecified: Secondary | ICD-10-CM | POA: Insufficient documentation

## 2022-08-18 DIAGNOSIS — M549 Dorsalgia, unspecified: Secondary | ICD-10-CM | POA: Insufficient documentation

## 2022-08-18 DIAGNOSIS — E119 Type 2 diabetes mellitus without complications: Secondary | ICD-10-CM | POA: Diagnosis not present

## 2022-08-18 DIAGNOSIS — Z8759 Personal history of other complications of pregnancy, childbirth and the puerperium: Secondary | ICD-10-CM | POA: Insufficient documentation

## 2022-08-18 DIAGNOSIS — Z79899 Other long term (current) drug therapy: Secondary | ICD-10-CM | POA: Diagnosis not present

## 2022-08-18 DIAGNOSIS — R42 Dizziness and giddiness: Secondary | ICD-10-CM | POA: Insufficient documentation

## 2022-08-18 DIAGNOSIS — N898 Other specified noninflammatory disorders of vagina: Secondary | ICD-10-CM | POA: Diagnosis not present

## 2022-08-18 LAB — URINALYSIS, ROUTINE W REFLEX MICROSCOPIC
Bacteria, UA: NONE SEEN
Bilirubin Urine: NEGATIVE
Glucose, UA: >=500 mg/dL — AB
Ketones, ur: NEGATIVE mg/dL
Leukocytes,Ua: NEGATIVE
Nitrite: NEGATIVE
Protein, ur: 100 mg/dL — AB
RBC / HPF: >50 RBC/hpf — ABNORMAL HIGH (ref 0–5)
Specific Gravity, Urine: 1.027 (ref 1.005–1.030)
WBC, UA: >50 WBC/hpf — ABNORMAL HIGH (ref 0–5)
pH: 5 (ref 5.0–8.0)

## 2022-08-18 LAB — GLUCOSE, CAPILLARY: Glucose-Capillary: 119 mg/dL — ABNORMAL HIGH (ref 70–99)

## 2022-08-18 LAB — CBC
HCT: 32.9 % — ABNORMAL LOW (ref 36.0–46.0)
Hemoglobin: 10.8 g/dL — ABNORMAL LOW (ref 12.0–15.0)
MCH: 27.4 pg (ref 26.0–34.0)
MCHC: 32.8 g/dL (ref 30.0–36.0)
MCV: 83.5 fL (ref 80.0–100.0)
Platelets: 356 10*3/uL (ref 150–400)
RBC: 3.94 MIL/uL (ref 3.87–5.11)
RDW: 23.7 % — ABNORMAL HIGH (ref 11.5–15.5)
WBC: 10.8 10*3/uL — ABNORMAL HIGH (ref 4.0–10.5)
nRBC: 0 % (ref 0.0–0.2)

## 2022-08-18 LAB — TYPE AND SCREEN
ABO/RH(D): O NEG
Antibody Screen: POSITIVE

## 2022-08-18 LAB — HCG, QUANTITATIVE, PREGNANCY: hCG, Beta Chain, Quant, S: 21 m[IU]/mL — ABNORMAL HIGH (ref <5)

## 2022-08-18 MED ORDER — ACETAMINOPHEN 500 MG PO TABS
1000.0000 mg | ORAL_TABLET | Freq: Once | ORAL | Status: AC
  Administered 2022-08-18: 1000 mg via ORAL
  Filled 2022-08-18: qty 2

## 2022-08-18 NOTE — MAU Note (Signed)
.  [name] is a 41 y.o. at 54w6dhere in MAU reporting: has been having an ectopic work up - took 1 dose of methotrexate and having follow up HCG levels - numbers have been coming down and bleeding was decreasing. Today woke up and "there was blood everywhere" - keeps having gushes of blood when she stands. reports having to change her pad 3x in the past hour. Reports left lower abdominal cramping that comes and goes.   Pain score: 6 Vitals:   08/18/22 0200  BP: 136/86  Pulse: 96  Resp: 18  Temp: 98.6 F (37 C)  SpO2: 100%      Lab orders placed from triage:  UA

## 2022-08-18 NOTE — MAU Provider Note (Signed)
History     CSN: [id number]  Arrival date and time: 08/18/22 0141   Event Date/Time   First Provider Initiated Contact with Patient 08/18/22 0324      Chief Complaint  Patient presents with   Vaginal Bleeding   Vaginal Bleeding The patient's primary symptoms include vaginal bleeding. This is a recurrent problem. The current episode started yesterday. The problem occurs constantly. The problem has been unchanged. The pain is mild. The problem affects both sides. She is not pregnant. Associated symptoms include back pain. Pertinent negatives include no abdominal pain. The vaginal discharge was bloody and copious. The vaginal bleeding is heavier than menses. She has been passing clots. The symptoms are aggravated by activity. She has tried nothing for the symptoms. Her past medical history is significant for an ectopic pregnancy.   Hx of ectopic pregnancy 10/31. S/p methotrexate. Had bHCG drawn this week. Prior to yesterday was having light spotting.   OB History     Gravida  3   Para  1   Term  1   Preterm      AB  1   Living  1      SAB      IAB  1   Ectopic      Multiple      Live Births  1           Past Medical History:  Diagnosis Date   Anemia    Anxiety    Diabetes mellitus without complication ([place])    Onychomycosis     Past Surgical History:  Procedure Laterality Date   NO PAST SURGERIES      Family History  Problem Relation Age of Onset   Hypertension Mother    Diabetes Mother    Diabetes Maternal Grandmother     Social History   Tobacco Use   Smoking status: Never   Smokeless tobacco: Never  Vaping Use   Vaping Use: Never used  Substance Use Topics   Alcohol use: Yes    Comment: occ   Drug use: Never    Allergies: No Known Allergies  Medications Prior to Admission  Medication Sig Dispense Refill Last Dose   ferrous sulfate 325 (65 FE) MG tablet TAKE 1 TABLET BY MOUTH TWICE A DAY 180 tablet 1 08/17/2022   metFORMIN  (GLUCOPHAGE) 500 MG tablet SMARTSIG:1 Tablet(s) By Mouth Every Evening   08/17/2022   Ascorbic Acid (VITAMIN C) 100 MG tablet Take 100 mg by mouth daily.      BLACK CURRANT SEED OIL PO Take by mouth.      cholecalciferol (VITAMIN D3) 25 MCG (1000 UNIT) tablet Take 1,000 Units by mouth daily.      Clindamycin-Benzoyl Per, Refr, gel Apply topically daily.      Elderberry 500 MG CAPS Take by mouth.      magnesium gluconate (MAGONATE) 500 MG tablet Take 500 mg by mouth daily.      NON FORMULARY soursop      promethazine (PHENERGAN) 25 MG tablet Take 1 tablet (25 mg total) by mouth every 6 (six) hours as needed for nausea or vomiting. 30 tablet 1    spironolactone (ALDACTONE) 50 MG tablet Take 1 tablet by mouth daily.      SYNJARDY XR 12.01-999 MG TB24 Take 1 tablet by mouth every morning.      traMADol (ULTRAM) 50 MG tablet Take 1-2 tablets (50-100 mg total) by mouth every 6 (six) hours as needed. 30 tablet 0  tretinoin (RETIN-A) 0.025 % cream Apply topically at bedtime.       Review of Systems  Constitutional:  Positive for activity change.  Respiratory:  Negative for shortness of breath.   Cardiovascular:  Negative for chest pain.  Gastrointestinal:  Negative for abdominal pain.  Genitourinary:  Positive for vaginal bleeding.  Musculoskeletal:  Positive for back pain.  Neurological:  Positive for light-headedness.   Physical Exam   Blood pressure 124/73, pulse 92, temperature 98.6 F (37 C), temperature source Oral, resp. rate 18, height '5\' 5"'$  (1.651 m), weight 73.9 kg, last menstrual period 06/10/2022, SpO2 100 %, unknown if currently breastfeeding.  Physical Exam Vitals and nursing note reviewed. Exam conducted with a chaperone present.  Constitutional:      General: She is not in acute distress.    Appearance: She is not ill-appearing, toxic-appearing or diaphoretic.  HENT:     Head: Normocephalic.  Eyes:     Extraocular Movements: Extraocular movements intact.      Conjunctiva/sclera: Conjunctivae normal.  Cardiovascular:     Rate and Rhythm: Normal rate and regular rhythm.     Pulses: Normal pulses.     Heart sounds: Normal heart sounds.  Pulmonary:     Effort: Pulmonary effort is normal.     Breath sounds: Normal breath sounds.  Genitourinary:    Comments: VULVA: normal appearing vulva with no masses, tenderness or lesions, VAGINA: vaginal discharge - copious and bloody, CERVIX: cervical discharge present - copious, dark, bloody, and odorless  Neurological:     Mental Status: She is alert.  Psychiatric:        Mood and Affect: Mood normal.        Behavior: Behavior normal.     MAU Course  Procedures  MDM bHCG 11/20- 26> today 21 UA- significant glucosuria; CBG 119 Hgb 10.8 (10.1 10/31) Tylenol x1 given  Assessment and Plan  1. Vaginal bleeding Likely continued shedding of uterine lining 2/2 ectopic pregnancy and s/p MTX treatment. Discussed this extensively and bleeding should decrease over the next 10-14 days. Could consider aygestin for continued bleeding.  -Follow up with [place] next week; message sent patient would like to be scheduled with [place] -Return precautions given  2. History of ectopic pregnancy - Improving bHCG    [name] 08/18/2022, 3:24 AM

## 2022-08-20 NOTE — Telephone Encounter (Signed)
Called patient to schedule follow up non stat Hcg. Shared results with patient with need to continue weekly levels until at 0. Patient agreeable to coming in tomorrow afternoon. Patient informed she will be called with results once they are back with recommendation for follow up as needed.

## 2022-08-21 ENCOUNTER — Other Ambulatory Visit: Payer: BC Managed Care – PPO

## 2022-08-23 ENCOUNTER — Ambulatory Visit (INDEPENDENT_AMBULATORY_CARE_PROVIDER_SITE_OTHER): Payer: BC Managed Care – PPO | Admitting: Advanced Practice Midwife

## 2022-08-23 ENCOUNTER — Encounter: Payer: Self-pay | Admitting: Advanced Practice Midwife

## 2022-08-23 VITALS — BP 122/80 | HR 103 | Wt 158.5 lb

## 2022-08-23 DIAGNOSIS — Z3A00 Weeks of gestation of pregnancy not specified: Secondary | ICD-10-CM

## 2022-08-23 DIAGNOSIS — O009 Unspecified ectopic pregnancy without intrauterine pregnancy: Secondary | ICD-10-CM

## 2022-08-23 DIAGNOSIS — N939 Abnormal uterine and vaginal bleeding, unspecified: Secondary | ICD-10-CM | POA: Diagnosis not present

## 2022-08-23 LAB — CBC
Hematocrit: 32.6 % — ABNORMAL LOW (ref 34.0–46.6)
Hemoglobin: 10.4 g/dL — ABNORMAL LOW (ref 11.1–15.9)
MCH: 27.4 pg (ref 26.6–33.0)
MCHC: 31.9 g/dL (ref 31.5–35.7)
MCV: 86 fL (ref 79–97)
Platelets: 371 10*3/uL (ref 150–450)
RBC: 3.8 x10E6/uL (ref 3.77–5.28)
RDW: 21.1 % — ABNORMAL HIGH (ref 11.7–15.4)
WBC: 9.6 10*3/uL (ref 3.4–10.8)

## 2022-08-23 NOTE — Progress Notes (Signed)
   Established Patient Office Visit  Subjective   Patient ID: [name], female    DOB: [birth year]  Age: 41 y.o. MRN: [id number]  No chief complaint on file.   Here for F/U after Dx ectopic pregnancy in MAU 07/24/22. Treated with MTX. Has a had a few episodes of heavy bleeding over the past week and was seen in MAU 08/18/22 for this. Hbg 10.8, up from 10.1 on 10/31. Thought to be normal shedding of uterine lining after Tx for ectopic pregnancy.    Rhophylac given 07/24/22.   Review of Systems  Constitutional:  Negative for chills and fever.  Gastrointestinal:  Negative for abdominal pain.  Genitourinary:        Positive for vaginal bleeding.  Negative for vaginal discharge     Objective:     BP 122/80   Pulse (!) 103   Wt 158 lb 8 oz (71.9 kg)   LMP 06/10/2022   Breastfeeding Unknown   BMI 26.38 kg/m     Physical Exam Constitutional:      General: She is not in acute distress.    Appearance: Normal appearance. She is normal weight. She is not ill-appearing.  Cardiovascular:     Rate and Rhythm: Normal rate.  Pulmonary:     Effort: Pulmonary effort is normal.  Genitourinary:    Comments: Declined pelvic exam Skin:    General: Skin is warm and dry.  Neurological:     Mental Status: She is alert and oriented to person, place, and time.  Psychiatric:        Mood and Affect: Mood normal.     Latest Reference Range & Units 07/24/22 11:37 07/27/22 08:20 07/30/22 09:41 08/07/22 11:32 08/13/22 08:28 08/18/22 04:37  hCG Quant mIU/mL  399 230 52 26   HCG, Beta Chain, Quant, S <5 mIU/mL      21 (H)  I-stat hCG, quantitative <5 mIU/mL 651.6 (H)           Latest Ref Rng & Units 08/18/2022    4:08 AM 07/24/2022   11:25 AM  CBC  WBC 3.4 - 10.8 x10E3/uL 10.8  14.2   Hemoglobin 11.1 - 15.9 g/dL 10.8  10.1   Hematocrit 34.0 - 46.6 % 32.9  31.7   Platelets 150 - 450 x10E3/uL 356  390    O NEG   Assessment & Plan:  Ectopic pregnancy treated with MTX. hCGs dropping  appropriately.   1. Ectopic pregnancy without intrauterine pregnancy, unspecified location  - Beta hCG quant (ref lab)  2. Vaginal bleeding  - Beta hCG quant (ref lab) - CBC  Continue weekly quants until <5 Bleeding precautions Continue PO Iron   Manya Silvas, CNM

## 2022-08-24 ENCOUNTER — Telehealth: Payer: Self-pay

## 2022-08-24 ENCOUNTER — Other Ambulatory Visit: Payer: Self-pay

## 2022-08-24 DIAGNOSIS — D509 Iron deficiency anemia, unspecified: Secondary | ICD-10-CM

## 2022-08-24 LAB — BETA HCG QUANT (REF LAB): hCG Quant: 14 m[IU]/mL

## 2022-08-24 NOTE — Telephone Encounter (Signed)
-----   Message from Marice Potter, RN sent at 06/22/2022  4:59 PM EDT ----- Regarding: labs Pt needs repeat cbc and iron panel. Need to enter orders.

## 2022-08-24 NOTE — Telephone Encounter (Signed)
Lab orders placed. Called pt and let her know about lab work. Pt verbalized understanding.

## 2022-08-28 ENCOUNTER — Other Ambulatory Visit (INDEPENDENT_AMBULATORY_CARE_PROVIDER_SITE_OTHER): Payer: BC Managed Care – PPO

## 2022-08-28 DIAGNOSIS — O009 Unspecified ectopic pregnancy without intrauterine pregnancy: Secondary | ICD-10-CM | POA: Insufficient documentation

## 2022-08-28 DIAGNOSIS — D509 Iron deficiency anemia, unspecified: Secondary | ICD-10-CM

## 2022-08-28 LAB — CBC WITH DIFFERENTIAL/PLATELET
Basophils Absolute: 0.1 10*3/uL (ref 0.0–0.1)
Basophils Relative: 0.6 % (ref 0.0–3.0)
Eosinophils Absolute: 0.1 10*3/uL (ref 0.0–0.7)
Eosinophils Relative: 0.8 % (ref 0.0–5.0)
HCT: 32.4 % — ABNORMAL LOW (ref 36.0–46.0)
Hemoglobin: 10.7 g/dL — ABNORMAL LOW (ref 12.0–15.0)
Lymphocytes Relative: 24 % (ref 12.0–46.0)
Lymphs Abs: 2.7 10*3/uL (ref 0.7–4.0)
MCHC: 33.1 g/dL (ref 30.0–36.0)
MCV: 84.2 fl (ref 78.0–100.0)
Monocytes Absolute: 0.7 10*3/uL (ref 0.1–1.0)
Monocytes Relative: 6.1 % (ref 3.0–12.0)
Neutro Abs: 7.8 10*3/uL — ABNORMAL HIGH (ref 1.4–7.7)
Neutrophils Relative %: 68.5 % (ref 43.0–77.0)
Platelets: 425 10*3/uL — ABNORMAL HIGH (ref 150.0–400.0)
RBC: 3.84 Mil/uL — ABNORMAL LOW (ref 3.87–5.11)
RDW: 23.2 % — ABNORMAL HIGH (ref 11.5–15.5)
WBC: 11.3 10*3/uL — ABNORMAL HIGH (ref 4.0–10.5)

## 2022-08-28 LAB — IBC + FERRITIN
Ferritin: 10.3 ng/mL (ref 10.0–291.0)
Iron: 97 ug/dL (ref 42–145)
Saturation Ratios: 21.9 % (ref 20.0–50.0)
TIBC: 443.8 ug/dL (ref 250.0–450.0)
Transferrin: 317 mg/dL (ref 212.0–360.0)

## 2022-08-29 ENCOUNTER — Inpatient Hospital Stay (HOSPITAL_COMMUNITY): Payer: BC Managed Care – PPO

## 2022-08-29 ENCOUNTER — Encounter (HOSPITAL_COMMUNITY)
Admission: AD | Disposition: A | Discharge status: Home / Self Care | Payer: Self-pay | Source: Home / Self Care | Attending: Obstetrics and Gynecology

## 2022-08-29 ENCOUNTER — Other Ambulatory Visit: Payer: Self-pay

## 2022-08-29 ENCOUNTER — Inpatient Hospital Stay (HOSPITAL_COMMUNITY)
Admission: AD | Admit: 2022-08-29 | Discharge: 2022-08-30 | Disposition: A | Discharge status: Home / Self Care | Payer: BC Managed Care – PPO | Attending: Obstetrics and Gynecology | Admitting: Obstetrics and Gynecology

## 2022-08-29 ENCOUNTER — Encounter (HOSPITAL_COMMUNITY): Payer: Self-pay | Admitting: Obstetrics and Gynecology

## 2022-08-29 ENCOUNTER — Inpatient Hospital Stay (HOSPITAL_COMMUNITY): Payer: BC Managed Care – PPO | Admitting: Anesthesiology

## 2022-08-29 DIAGNOSIS — O00102 Left tubal pregnancy without intrauterine pregnancy: Secondary | ICD-10-CM

## 2022-08-29 DIAGNOSIS — N814 Uterovaginal prolapse, unspecified: Secondary | ICD-10-CM | POA: Diagnosis not present

## 2022-08-29 DIAGNOSIS — N858 Other specified noninflammatory disorders of uterus: Secondary | ICD-10-CM

## 2022-08-29 DIAGNOSIS — D250 Submucous leiomyoma of uterus: Secondary | ICD-10-CM | POA: Insufficient documentation

## 2022-08-29 HISTORY — PX: OPERATIVE ULTRASOUND: SHX5996

## 2022-08-29 HISTORY — PX: DILATION AND CURETTAGE OF UTERUS: SHX78

## 2022-08-29 HISTORY — PX: DILATATION & CURETTAGE/HYSTEROSCOPY WITH MYOSURE: SHX6511

## 2022-08-29 LAB — CBC
HCT: 32.5 % — ABNORMAL LOW (ref 36.0–46.0)
Hemoglobin: 10.6 g/dL — ABNORMAL LOW (ref 12.0–15.0)
MCH: 27.7 pg (ref 26.0–34.0)
MCHC: 32.6 g/dL (ref 30.0–36.0)
MCV: 85.1 fL (ref 80.0–100.0)
Platelets: 427 10*3/uL — ABNORMAL HIGH (ref 150–400)
RBC: 3.82 MIL/uL — ABNORMAL LOW (ref 3.87–5.11)
RDW: 20.6 % — ABNORMAL HIGH (ref 11.5–15.5)
WBC: 9.9 10*3/uL (ref 4.0–10.5)
nRBC: 0 % (ref 0.0–0.2)

## 2022-08-29 LAB — COMPREHENSIVE METABOLIC PANEL
ALT: 19 U/L (ref 0–44)
AST: 18 U/L (ref 15–41)
Albumin: 4.2 g/dL (ref 3.5–5.0)
Alkaline Phosphatase: 41 U/L (ref 38–126)
Anion gap: 9 (ref 5–15)
BUN: 15 mg/dL (ref 6–20)
CO2: 25 mmol/L (ref 22–32)
Calcium: 9.4 mg/dL (ref 8.9–10.3)
Chloride: 104 mmol/L (ref 98–111)
Creatinine, Ser: 0.68 mg/dL (ref 0.44–1.00)
GFR, Estimated: >60 mL/min (ref >60)
Glucose, Bld: 117 mg/dL — ABNORMAL HIGH (ref 70–99)
Potassium: 3.4 mmol/L — ABNORMAL LOW (ref 3.5–5.1)
Sodium: 138 mmol/L (ref 135–145)
Total Bilirubin: 0.4 mg/dL (ref 0.3–1.2)
Total Protein: 7.7 g/dL (ref 6.5–8.1)

## 2022-08-29 LAB — HCG, QUANTITATIVE, PREGNANCY: hCG, Beta Chain, Quant, S: 13 m[IU]/mL — ABNORMAL HIGH (ref <5)

## 2022-08-29 SURGERY — DILATION AND CURETTAGE
Anesthesia: General

## 2022-08-29 MED ORDER — MIDAZOLAM HCL 2 MG/2ML IJ SOLN
INTRAMUSCULAR | Status: AC
  Filled 2022-08-29: qty 2

## 2022-08-29 MED ORDER — ESMOLOL HCL 100 MG/10ML IV SOLN
INTRAVENOUS | Status: AC
  Filled 2022-08-29: qty 10

## 2022-08-29 MED ORDER — ROCURONIUM BROMIDE 10 MG/ML (PF) SYRINGE
PREFILLED_SYRINGE | INTRAVENOUS | Status: DC | PRN
  Administered 2022-08-29 (×2): 20 mg via INTRAVENOUS

## 2022-08-29 MED ORDER — PROPOFOL 10 MG/ML IV BOLUS
INTRAVENOUS | Status: DC | PRN
  Administered 2022-08-29: 180 mg via INTRAVENOUS
  Administered 2022-08-29: 20 mg via INTRAVENOUS

## 2022-08-29 MED ORDER — 0.9 % SODIUM CHLORIDE (POUR BTL) OPTIME
TOPICAL | Status: DC | PRN
  Administered 2022-08-29: 1000 mL

## 2022-08-29 MED ORDER — ACETAMINOPHEN 10 MG/ML IV SOLN
INTRAVENOUS | Status: AC
  Filled 2022-08-29: qty 100

## 2022-08-29 MED ORDER — DEXAMETHASONE SODIUM PHOSPHATE 10 MG/ML IJ SOLN
INTRAMUSCULAR | Status: DC | PRN
  Administered 2022-08-29: 10 mg via INTRAVENOUS

## 2022-08-29 MED ORDER — FENTANYL CITRATE (PF) 250 MCG/5ML IJ SOLN
INTRAMUSCULAR | Status: DC | PRN
  Administered 2022-08-29 (×2): 100 ug via INTRAVENOUS
  Administered 2022-08-29 (×2): 25 ug via INTRAVENOUS

## 2022-08-29 MED ORDER — PHENYLEPHRINE HCL (PRESSORS) 10 MG/ML IV SOLN
INTRAVENOUS | Status: DC | PRN
  Administered 2022-08-29 (×4): 80 ug via INTRAVENOUS

## 2022-08-29 MED ORDER — PROPOFOL 10 MG/ML IV BOLUS
INTRAVENOUS | Status: AC
  Filled 2022-08-29: qty 20

## 2022-08-29 MED ORDER — SODIUM CHLORIDE 0.9 % IR SOLN
Status: DC | PRN
  Administered 2022-08-29: 3000 mL

## 2022-08-29 MED ORDER — PHENYLEPHRINE 80 MCG/ML (10ML) SYRINGE FOR IV PUSH (FOR BLOOD PRESSURE SUPPORT)
PREFILLED_SYRINGE | INTRAVENOUS | Status: DC | PRN
  Administered 2022-08-29: 80 ug via INTRAVENOUS

## 2022-08-29 MED ORDER — LIDOCAINE 2% (20 MG/ML) 5 ML SYRINGE
INTRAMUSCULAR | Status: AC
  Filled 2022-08-29: qty 5

## 2022-08-29 MED ORDER — SODIUM CHLORIDE 0.9 % IV SOLN
100.0000 mg | Freq: Once | INTRAVENOUS | Status: AC
  Administered 2022-08-29: 100 mg via INTRAVENOUS
  Filled 2022-08-29: qty 100

## 2022-08-29 MED ORDER — SUCCINYLCHOLINE CHLORIDE 200 MG/10ML IV SOSY
PREFILLED_SYRINGE | INTRAVENOUS | Status: DC | PRN
  Administered 2022-08-29: 100 mg via INTRAVENOUS

## 2022-08-29 MED ORDER — PHENYLEPHRINE 80 MCG/ML (10ML) SYRINGE FOR IV PUSH (FOR BLOOD PRESSURE SUPPORT)
PREFILLED_SYRINGE | INTRAVENOUS | Status: AC
  Filled 2022-08-29: qty 10

## 2022-08-29 MED ORDER — LACTATED RINGERS IV SOLN: INTRAVENOUS | Status: DC | PRN

## 2022-08-29 MED ORDER — ONDANSETRON HCL 4 MG/2ML IJ SOLN
INTRAMUSCULAR | Status: AC
  Filled 2022-08-29: qty 2

## 2022-08-29 MED ORDER — ROCURONIUM BROMIDE 10 MG/ML (PF) SYRINGE
PREFILLED_SYRINGE | INTRAVENOUS | Status: AC
  Filled 2022-08-29: qty 10

## 2022-08-29 MED ORDER — SUCCINYLCHOLINE CHLORIDE 200 MG/10ML IV SOSY
PREFILLED_SYRINGE | INTRAVENOUS | Status: AC
  Filled 2022-08-29: qty 10

## 2022-08-29 MED ORDER — MIDAZOLAM HCL 2 MG/2ML IJ SOLN
INTRAMUSCULAR | Status: DC | PRN
  Administered 2022-08-29: 2 mg via INTRAVENOUS

## 2022-08-29 MED ORDER — ONDANSETRON HCL 4 MG/2ML IJ SOLN
INTRAMUSCULAR | Status: DC | PRN
  Administered 2022-08-29: 4 mg via INTRAVENOUS

## 2022-08-29 MED ORDER — DEXAMETHASONE SODIUM PHOSPHATE 10 MG/ML IJ SOLN
INTRAMUSCULAR | Status: AC
  Filled 2022-08-29: qty 1

## 2022-08-29 MED ORDER — MISOPROSTOL 200 MCG PO TABS
ORAL_TABLET | ORAL | Status: AC
  Filled 2022-08-29: qty 4

## 2022-08-29 MED ORDER — PROPOFOL 1000 MG/100ML IV EMUL
INTRAVENOUS | Status: AC
  Filled 2022-08-29: qty 100

## 2022-08-29 MED ORDER — FENTANYL CITRATE (PF) 250 MCG/5ML IJ SOLN
INTRAMUSCULAR | Status: AC
  Filled 2022-08-29: qty 5

## 2022-08-29 SURGICAL SUPPLY — 31 items
CATH ROBINSON RED A/P 16FR (CATHETERS) ×4 IMPLANT
CNTNR URN SCR LID CUP LEK RST (MISCELLANEOUS) ×2 IMPLANT
CONT SPEC 4OZ STRL OR WHT (MISCELLANEOUS) ×2
DEVICE MYOSURE REACH (MISCELLANEOUS) IMPLANT
FILTER UTR ASPR ASSEMBLY (MISCELLANEOUS) ×2 IMPLANT
GAUZE 4X4 16PLY ~~LOC~~+RFID DBL (SPONGE) IMPLANT
GLOVE BIOGEL PI IND STRL 7.0 (GLOVE) ×6 IMPLANT
GLOVE ECLIPSE 6.5 STRL STRAW (GLOVE) ×2 IMPLANT
GLOVE SURG ORTHO 8.0 STRL STRW (GLOVE) ×2 IMPLANT
GOWN STRL REUS W/ TWL LRG LVL3 (GOWN DISPOSABLE) ×8 IMPLANT
GOWN STRL REUS W/TWL LRG LVL3 (GOWN DISPOSABLE) ×8
HOSE CONNECTING 18IN BERKELEY (TUBING) ×2 IMPLANT
KIT BERKELEY 1ST TRI 3/8 NO TR (MISCELLANEOUS) ×2 IMPLANT
KIT BERKELEY 1ST TRIMESTER 3/8 (MISCELLANEOUS) ×2 IMPLANT
KIT PROCEDURE FLUENT (KITS) IMPLANT
KIT TURNOVER KIT B (KITS) ×2 IMPLANT
NS IRRIG 1000ML POUR BTL (IV SOLUTION) ×2 IMPLANT
PACK VAGINAL MINOR WOMEN LF (CUSTOM PROCEDURE TRAY) ×4 IMPLANT
PAD GROUNDING ACESSA PRO VU (MISCELLANEOUS) IMPLANT
PAD OB MATERNITY 4.3X12.25 (PERSONAL CARE ITEMS) ×4 IMPLANT
SEAL CERVICAL OMNI LOK (ABLATOR) IMPLANT
SET BERKELEY SUCTION TUBING (SUCTIONS) ×2 IMPLANT
SPIKE FLUID TRANSFER (MISCELLANEOUS) ×4 IMPLANT
TOWEL GREEN STERILE FF (TOWEL DISPOSABLE) ×8 IMPLANT
TUBE SUCTION HIGH CAP CLEAR NV (SUCTIONS) IMPLANT
UNDERPAD 30X36 HEAVY ABSORB (UNDERPADS AND DIAPERS) ×4 IMPLANT
VACURETTE 10 RIGID CVD (CANNULA) IMPLANT
VACURETTE 7MM CVD STRL WRAP (CANNULA) IMPLANT
VACURETTE 8 RIGID CVD (CANNULA) IMPLANT
VACURETTE 9 RIGID CVD (CANNULA) IMPLANT
YANKAUER SUCT BULB TIP NO VENT (SUCTIONS) IMPLANT

## 2022-08-29 NOTE — Anesthesia Preprocedure Evaluation (Signed)
Anesthesia Evaluation  Patient identified by MRN, date of birth, ID band Patient awake    Reviewed: Allergy & Precautions, NPO status , Patient's Chart, lab work & pertinent test results  Airway Mallampati: II  TM Distance: >3 FB Neck ROM: Full    Dental  (+) Dental Advisory Given   Pulmonary neg pulmonary ROS   breath sounds clear to auscultation       Cardiovascular negative cardio ROS  Rhythm:Regular Rate:Normal     Neuro/Psych negative neurological ROS     GI/Hepatic negative GI ROS, Neg liver ROS,,,  Endo/Other  diabetes, Type 2, Oral Hypoglycemic Agents    Renal/GU negative Renal ROS     Musculoskeletal   Abdominal   Peds  Hematology  (+) Blood dyscrasia, anemia   Anesthesia Other Findings   Reproductive/Obstetrics                              Lab Results  Component Value Date   WBC 9.9 08/29/2022   HGB 10.6 (L) 08/29/2022   HCT 32.5 (L) 08/29/2022   MCV 85.1 08/29/2022   PLT 427 (H) 08/29/2022   Lab Results  Component Value Date   CREATININE 0.68 08/29/2022   BUN 15 08/29/2022   NA 138 08/29/2022   K 3.4 (L) 08/29/2022   CL 104 08/29/2022   CO2 25 08/29/2022    Anesthesia Physical Anesthesia Plan  ASA: 2 and emergent  Anesthesia Plan: General   Post-op Pain Management: Ofirmev IV (intra-op)* and Toradol IV (intra-op)*   Induction: Intravenous  PONV Risk Score and Plan: 3 and Dexamethasone, Ondansetron, Midazolam and Treatment may vary due to age or medical condition  Airway Management Planned: Oral ETT  Additional Equipment:   Intra-op Plan:   Post-operative Plan: Extubation in OR  Informed Consent: I have reviewed the patients History and Physical, chart, labs and discussed the procedure including the risks, benefits and alternatives for the proposed anesthesia with the patient or authorized representative who has indicated his/her understanding and  acceptance.     Dental advisory given  Plan Discussed with: CRNA  Anesthesia Plan Comments:          Anesthesia Quick Evaluation

## 2022-08-29 NOTE — MAU Note (Signed)
[name] is a 41 y.o. at Unknown here in MAU reporting: has been doing ok since MTX.  Occ pains, Tylenol relieves it.  Has been having ongoing bleeding, not heavy. Sent from Dr Garwin Brothers office for further eval and [place].  Onset of complaint: ongoing bleeding Pain score: none Vitals:   08/29/22 1644  BP: 139/72  Pulse: 91  Resp: 18  Temp: 98.8 F (37.1 C)  SpO2: 100%     Lab orders placed from triage:  orders per Dr Loletha Grayer

## 2022-08-29 NOTE — MAU Provider Note (Signed)
History     Chief Complaint  Patient presents with   Follow-up   41 yo [id number] BF previously under [facility] sent from my office  after being seen for the first time with finding of a 3 cm prolapsing uterine mass. Hx notable for excessive vaginal bleeding in Oct  and which time she was evaluated and found to have an adnexal mass with presumed ectopic pregnancy. Pt was given MTX and has had serial hquants which has been dropping appropriately. Pt continues to have vaginal bleeding for which she was told was due to lining of the endometrium shedding  OB History     Gravida  5   Para  1   Term  1   Preterm      AB  1   Living  1      SAB      IAB  3   Ectopic   1   Multiple      Live Births  1           Past Medical History:  Diagnosis Date   Anemia    Anxiety    Diabetes mellitus without complication ([place])    Onychomycosis     Past Surgical History:  Procedure Laterality Date   TAB      Family History  Problem Relation Age of Onset   Hypertension Mother    Diabetes Mother    Diabetes Maternal Grandmother     Social History   Tobacco Use   Smoking status: Never   Smokeless tobacco: Never  Vaping Use   Vaping Use: Never used  Substance Use Topics   Alcohol use: Yes    Comment: occ   Drug use: Never    Allergies: No Known Allergies  Medications Prior to Admission  Medication Sig Dispense Refill Last Dose   Ascorbic Acid (VITAMIN C) 100 MG tablet Take 100 mg by mouth daily.      BLACK CURRANT SEED OIL PO Take by mouth.      cholecalciferol (VITAMIN D3) 25 MCG (1000 UNIT) tablet Take 1,000 Units by mouth daily.      Clindamycin-Benzoyl Per, Refr, gel Apply topically daily.      Elderberry 500 MG CAPS Take by mouth.      ferrous sulfate 325 (65 FE) MG tablet TAKE 1 TABLET BY MOUTH TWICE A DAY 180 tablet 1    magnesium gluconate (MAGONATE) 500 MG tablet Take 500 mg by mouth daily.      metFORMIN (GLUCOPHAGE) 500 MG tablet  SMARTSIG:1 Tablet(s) By Mouth Every Evening      NON FORMULARY soursop      promethazine (PHENERGAN) 25 MG tablet Take 1 tablet (25 mg total) by mouth every 6 (six) hours as needed for nausea or vomiting. 30 tablet 1    spironolactone (ALDACTONE) 50 MG tablet Take 1 tablet by mouth daily.      SYNJARDY XR 12.01-999 MG TB24 Take 1 tablet by mouth every morning.      traMADol (ULTRAM) 50 MG tablet Take 1-2 tablets (50-100 mg total) by mouth every 6 (six) hours as needed. 30 tablet 0    tretinoin (RETIN-A) 0.025 % cream Apply topically at bedtime.        Physical Exam   Blood pressure 139/72, pulse 91, temperature 98.8 F (37.1 C), temperature source Oral, resp. rate 18, height '5\' 5"'$  (1.651 m), weight 72.5 kg, SpO2 100 %, unknown if currently breastfeeding.  General appearance: alert,  cooperative, and no distress Lungs: clear to auscultation bilaterally Heart: regular rate and rhythm, S1, S2 normal, no murmur, click, rub or gallop Abdomen: soft, non-tender; bowel sounds normal; no masses,  no organomegaly Pelvic done in office  US OB Transvaginal  Result Date: 08/29/2022 CLINICAL DATA:  Recent ectopic pregnancy prolapsing fibroid through cervix EXAM: TRANSVAGINAL OB ULTRASOUND TECHNIQUE: Transvaginal ultrasound was performed for complete evaluation of the gestation as well as the maternal uterus, adnexal regions, and pelvic cul-de-sac. COMPARISON:  07/24/2022 FINDINGS: Intrauterine gestational sac: None Yolk sac:  Not Visualized. Embryo:  Not Visualized. Maternal uterus/adnexae: Marked heterogenous endometrial thickening measuring up to 39 mm. Cervix is distended by heterogenous mass or material that appears contiguous with the endometrium. This is probably present on the ultrasound from October. Small fibroid within the posterior uterine corpus measuring 16 x 10 x 17 mm. The previously noted left adnexal mass/possible ectopic pregnancy is no longer visualized. IMPRESSION: 1. Marked heterogenous  slightly vascular endometrial thickening that extends from the fundus to the uterine cervix, the cervix is distended with heterogenous tissue or material that is contiguous with the endometrium. Findings could be secondary to residual products of conception, though other consideration could include endometrial/cervical mass lesion. 2. No IUP identified. Previously described left adnexal mass/possible ectopic is not identified on today's study. Electronically Signed   By: Donavan Foil M.D.   On: 08/29/2022 17:42       Latest Ref Rng & Units 08/29/2022    4:30 PM 08/28/2022    8:57 AM 08/23/2022    9:38 AM  CBC  WBC 4.0 - 10.5 K/uL 9.9  11.3  9.6   Hemoglobin 12.0 - 15.0 g/dL 10.6  10.7  10.4   Hematocrit 36.0 - 46.0 % 32.5  32.4  32.6   Platelets 150 - 400 K/uL 427  425.0  371        Latest Ref Rng & Units 08/29/2022    4:30 PM 07/24/2022   11:25 AM 07/12/2021    2:47 AM  CMP  Glucose 70 - 99 mg/dL 117  107  162   BUN 6 - 20 mg/dL 15  16  <5   Creatinine 0.44 - 1.00 mg/dL 0.68  0.70  0.68   Sodium 135 - 145 mmol/L 138  138  135   Potassium 3.5 - 5.1 mmol/L 3.4  4.1  3.4   Chloride 98 - 111 mmol/L 104  104  98   CO2 22 - 32 mmol/L '25  23  25   '$ Calcium 8.9 - 10.3 mg/dL 9.4  9.9  8.4   Total Protein 6.5 - 8.1 g/dL 7.7  7.2    Total Bilirubin 0.3 - 1.2 mg/dL 0.4  0.5    Alkaline Phos 38 - 126 U/L 41  44    AST 15 - 41 U/L 18  15    ALT 0 - 44 U/L 19  14     Hquant 13  IMP: prolapsing uterine mass in a setting of a presumed ectopic pregnancy DM P) ultrasound guided suction dilation and curettage, excision of prolapsing mass ? Fibroid, possible diagnostic hysteroscopy, resection of endometrial mass using myosure.  Risk of procedure explained including infection, bleeding, uterine perforation ( 09/998), possible need for blood transfusion and  its risk, inability to remove mass completely,. Rare possibility of hysterectomy for life threatening bleeding. All ? Answered. OR notified . ED  Course   MDM   Marvene Staff, MD 7:04 PM 08/29/2022

## 2022-08-29 NOTE — Anesthesia Procedure Notes (Signed)
Procedure Name: Intubation Date/Time: 08/29/2022 8:51 PM  Performed by: Alvy Bimler, CRNAPre-anesthesia Checklist: Patient identified, Emergency Drugs available, Suction available and Patient being monitored Patient Re-evaluated:Patient Re-evaluated prior to induction Oxygen Delivery Method: Circle system utilized Preoxygenation: Pre-oxygenation with 100% oxygen Induction Type: IV induction Ventilation: Mask ventilation without difficulty Laryngoscope Size: Mac and 3 Grade View: Grade I Tube type: Oral Tube size: 7.0 mm Number of attempts: 1 Airway Equipment and Method: Stylet and Oral airway Placement Confirmation: ETT inserted through vocal cords under direct vision, positive ETCO2 and breath sounds checked- equal and bilateral Secured at: 23 cm Tube secured with: Tape Dental Injury: Teeth and Oropharynx as per pre-operative assessment

## 2022-08-30 ENCOUNTER — Other Ambulatory Visit (HOSPITAL_COMMUNITY): Payer: Self-pay | Admitting: Obstetrics and Gynecology

## 2022-08-30 DIAGNOSIS — N858 Other specified noninflammatory disorders of uterus: Secondary | ICD-10-CM

## 2022-08-30 DIAGNOSIS — N814 Uterovaginal prolapse, unspecified: Secondary | ICD-10-CM | POA: Diagnosis not present

## 2022-08-30 LAB — GLUCOSE, CAPILLARY: Glucose-Capillary: 167 mg/dL — ABNORMAL HIGH (ref 70–99)

## 2022-08-30 LAB — POCT I-STAT, CHEM 8
BUN: 16 mg/dL (ref 6–20)
Calcium, Ion: 1.16 mmol/L (ref 1.15–1.40)
Chloride: 104 mmol/L (ref 98–111)
Creatinine, Ser: 0.6 mg/dL (ref 0.44–1.00)
Glucose, Bld: 143 mg/dL — ABNORMAL HIGH (ref 70–99)
HCT: 23 % — ABNORMAL LOW (ref 36.0–46.0)
Hemoglobin: 7.8 g/dL — ABNORMAL LOW (ref 12.0–15.0)
Potassium: 3.7 mmol/L (ref 3.5–5.1)
Sodium: 138 mmol/L (ref 135–145)
TCO2: 23 mmol/L (ref 22–32)

## 2022-08-30 LAB — TYPE AND SCREEN
ABO/RH(D): O NEG
Antibody Screen: POSITIVE

## 2022-08-30 MED ORDER — SUGAMMADEX SODIUM 200 MG/2ML IV SOLN
INTRAVENOUS | Status: DC | PRN
  Administered 2022-08-29: 200 mg via INTRAVENOUS

## 2022-08-30 MED ORDER — AMISULPRIDE (ANTIEMETIC) 5 MG/2ML IV SOLN
INTRAVENOUS | Status: AC
  Administered 2022-08-30: 10 mg
  Filled 2022-08-30: qty 2

## 2022-08-30 MED ORDER — FENTANYL CITRATE (PF) 100 MCG/2ML IJ SOLN
INTRAMUSCULAR | Status: AC
  Filled 2022-08-30: qty 2

## 2022-08-30 MED ORDER — AMISULPRIDE (ANTIEMETIC) 5 MG/2ML IV SOLN: 10.0000 mg | Freq: Once | INTRAVENOUS | Status: DC | PRN

## 2022-08-30 MED ORDER — ACETAMINOPHEN 10 MG/ML IV SOLN
INTRAVENOUS | Status: DC | PRN
  Administered 2022-08-29: 1000 mg via INTRAVENOUS

## 2022-08-30 MED ORDER — FENTANYL CITRATE (PF) 100 MCG/2ML IJ SOLN
25.0000 ug | INTRAMUSCULAR | Status: DC | PRN
  Administered 2022-08-30 (×2): 50 ug via INTRAVENOUS

## 2022-08-30 MED ORDER — OXYCODONE HCL 5 MG PO TABS: 5.0000 mg | ORAL_TABLET | Freq: Four times a day (QID) | ORAL | 0 refills | Status: AC | PRN

## 2022-08-30 MED ORDER — IBUPROFEN 800 MG PO TABS: 800.0000 mg | ORAL_TABLET | Freq: Three times a day (TID) | ORAL | 5 refills | Status: DC | PRN

## 2022-08-30 NOTE — Transfer of Care (Signed)
Immediate Anesthesia Transfer of Care Note  Patient: [name]  Procedure(s) Performed: ULTRASOUND GUIDED  DILATATION AND CURETTAGE,  EXCISION PROLASPED FIBROIDS OPERATIVE ULTRASOUND DILATATION & CURETTAGE/HYSTEROSCOPY WITH [name]  Patient Location: PACU  Anesthesia Type:General  Level of Consciousness: awake, alert , and oriented  Airway & Oxygen Therapy: Patient Spontanous Breathing  Post-op Assessment: Report given to RN and Post -op Vital signs reviewed and stable  Post vital signs: Reviewed and stable  Last Vitals:  Vitals Value Taken Time  BP    Temp    Pulse    Resp    SpO2      Last Pain:  Vitals:   08/29/22 1644  TempSrc: Oral         Complications: No notable events documented.

## 2022-08-30 NOTE — Op Note (Signed)
[name], [name] MEDICAL RECORD NO: [id number] ACCOUNT NO: [id number] DATE OF BIRTH: [birth year] FACILITY: [facility] LOCATION: [facility] PHYSICIAN: Tacha Manni A. Tamanika Heiney, MD  Operative Report   DATE OF PROCEDURE: 08/29/2022  PREOPERATIVE DIAGNOSES:  Prolapsed uterine mass, ectopic pregnancy, positive pregnancy test.  PROCEDURE:  Excision of prolapsing submucosal fibroid, diagnostic hysteroscopy, hysteroscopic resection of submucosal fibroids using Myosure and ultrasound-guided suction Dilation and Curettage.  POSTOPERATIVE DIAGNOSES:  Prolapsed submucosal fibroid,  submucosal fibroids, presumed ectopic pregnancy, positive pregnancy test.  ANESTHESIA:  General.  SURGEON:  Servando Salina, MD  ASSISTANT:  None.  DESCRIPTION OF PROCEDURE:  Under adequate general anesthesia, the patient was placed in the dorsal lithotomy position.  The bladder was not catheterized due to planned use of ultrasound.  A weighted speculum was placed in the vagina.  Sims retractor was  placed anteriorly.  Of note, was at least a 3.5 cm mass protruding through the cervix, which the cervix been dilated.  The mass was firm.  The anterior and posterior lip of the cervix was grasped with a ring clamp and a 0 Vicryl figure-of-eight suture  was placed at the 3 and 9 o'clock position to aid with bleeding.  The ultrasound technologist was present and under ultrasound guidance, the uterus was scanned,  the mass was grasped, using digital finger, it was apparent that the mass was at least  extending up to the fundal region towards the patient's left side. With traction and counter traction and twisting, the mass was eventually removed as was a submucosal fibroid.  It was then noted on ultrasound that there was still a piece of tissue noted  in the left upper part. At that point, decision was made to suction the uterine cavity which was scant tissue as well as exploration using the ring clamp, a small additional piece of  fibroid was removed with the specimen.  The ultrasound showed there  was still a piece of fibroid remaining in the cavity, concern for possible bleeding, further the decision was then made to plan for hysteroscopic resection.  The diagnostic hysteroscope was then introduced with the [facility] and the cavity was distorted.   White tissue consistent with fibroids was noted.  Using the Reach resectoscope, as much of the resection could be done was performed and with the visibility still being limited, as much of resection that was done, the procedure was then terminated.  The  resectoscope was removed.  The sutures were cut short and bimanual massage was done.  The bladder was catheterized for 100 mL. Rectally 800 mcg of Cytotec was introduced to control what seemed to be a small but constant  dripping of blood from the cervix.  The  intraoperative i-STAT was performed, which gave a hemoglobin was 7.5. All instruments had been removed from the vagina.  SPECIMEN:  1.  The prolapsed fibroid with the resections. 2.  Endometrial curetting or suction with question of the POCs.  ESTIMATED BLOOD LOSS: 450 ml.  INTRAOPERATIVE FLUIDS:  2 liters.  URINE OUTPUT:  100 ml.  Sponge and instrument counts were correct.  The patient tolerated the procedure well and was transferred to recovery room in stable condition.   SHW D: 08/29/2022 11:55:58 pm T: 08/30/2022 4:28:00 am  JOB: [id number]/ [id number]

## 2022-08-30 NOTE — Brief Op Note (Signed)
08/29/2022  12:15 AM  PATIENT:  [name]  41 y.o. female  PRE-OPERATIVE DIAGNOSIS:  Prolapsed Uterine MASS/Fibroid, presumed ectopic pregnancy s/p MTX treatment, positive pregnancy test, Abnormal uterine bleeding  POST-OPERATIVE DIAGNOSIS:  PROLAPSED UTERINE FIBROID, SUBMUCOSAL FIBROID, ECTOPIC PREGNANCY, positive pregnancy test, abnormal uterine bleeding  PROCEDURE:  Excision of prolapsing submucosal/intracavitary fibroid, ultrasound guided suction Dilation and curettage, diagnostic hysteroscopy, hysteroscopic resection of submucosal fibroid  SURGEON:  Surgeon(s) and Role:    Servando Salina, MD - Primary  PHYSICIAN ASSISTANT:   ASSISTANTS: none  Findings: large prolapsing mass c/w fibroid through cervix with attachment left fundal at best by ultrasound. Upper part of cavity fluid filled and irregular wall ANESTHESIA:   general  EBL:  500 mL   BLOOD ADMINISTERED:none  DRAINS: none   LOCAL MEDICATIONS USED:  NONE  SPECIMEN:  Source of Specimen:  fibroid and fibroid resections, emc ? poc  DISPOSITION OF SPECIMEN:  PATHOLOGY  COUNTS:  YES  TOURNIQUET:  * No tourniquets in log *  DICTATION: .Other Dictation: Dictation Number [id number]  PLAN OF CARE: Discharge to home after PACU  PATIENT DISPOSITION:  PACU - hemodynamically stable.   Delay start of Pharmacological VTE agent (>24hrs) due to surgical blood loss or risk of bleeding: no

## 2022-08-30 NOTE — Discharge Instructions (Signed)
CALL  IF TEMP>100.4, NOTHING PER VAGINA X 2 WK, CALL IF SOAKING A MAXI  PAD EVERY HOUR OR MORE FREQUENTLY

## 2022-08-31 ENCOUNTER — Encounter (HOSPITAL_COMMUNITY): Payer: Self-pay | Admitting: Obstetrics and Gynecology

## 2022-08-31 LAB — SURGICAL PATHOLOGY

## 2022-09-04 IMAGING — CT CT ABD-PELV W/ CM
3 of 5 series · 16 of 46 positions shown, 18 images · IV contrast (APPLIED)
Comparison: None.

CLINICAL DATA: Abdominal pain and fever

EXAM:
CT ABDOMEN AND PELVIS WITH CONTRAST
TECHNIQUE: Multidetector CT imaging of the abdomen and pelvis was performed
using the standard protocol following bolus administration of
intravenous contrast.
CONTRAST:  100mL OMNIPAQUE IOHEXOL 350 MG/ML SOLN

[Series 3: abdomen 5.0 · axial · 0.76mm/px · z∈[+615,+1000]mm · 11 of 93 slices shown, 13 images]
[im 8/93  soft-tissue]
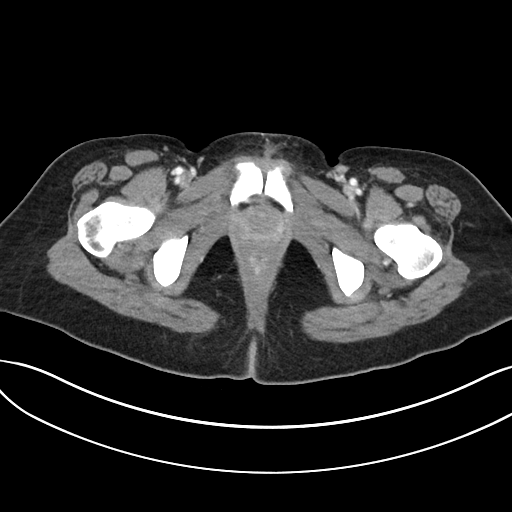
[im 8/93  bone]
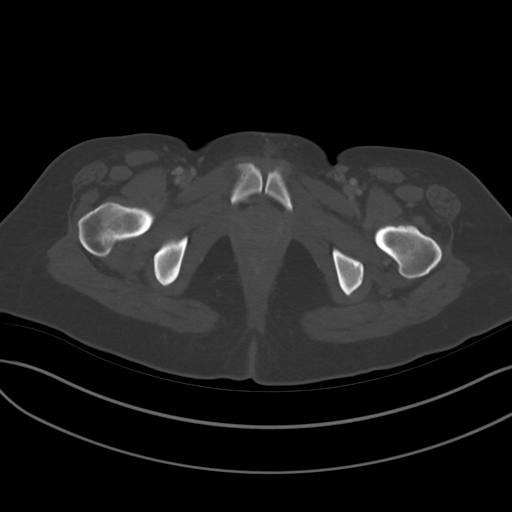
[im 16/93  soft-tissue]
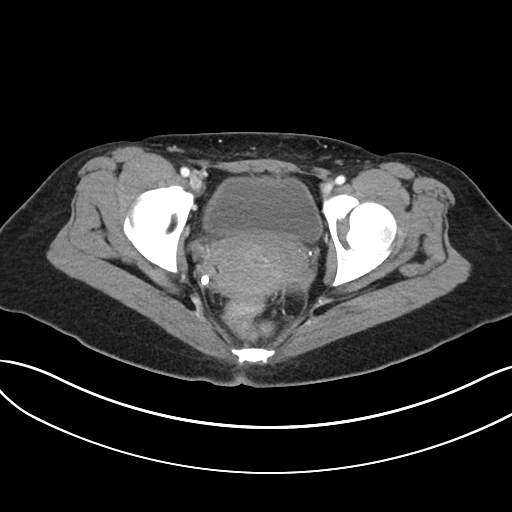
[im 24/93  soft-tissue]
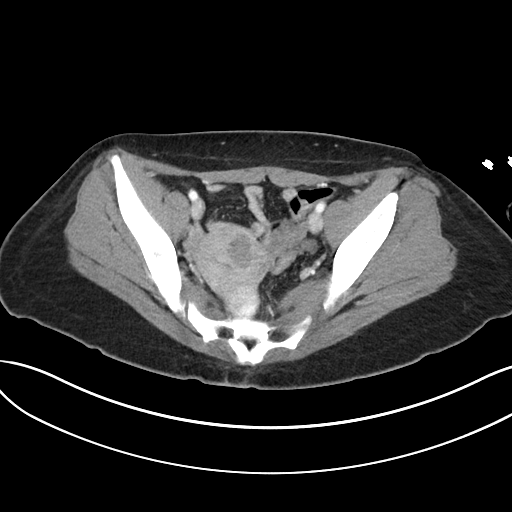
[im 31/93  soft-tissue]
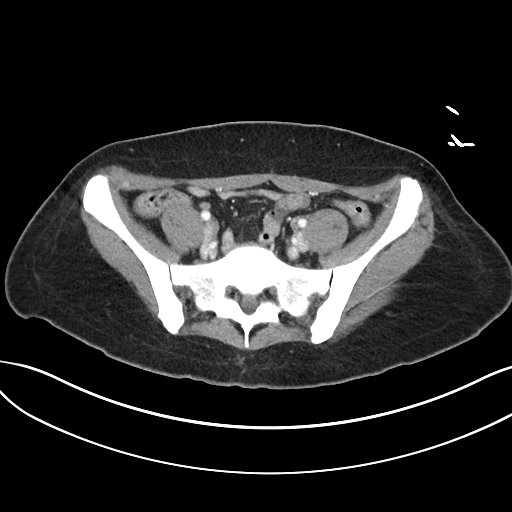
[im 39/93  soft-tissue]
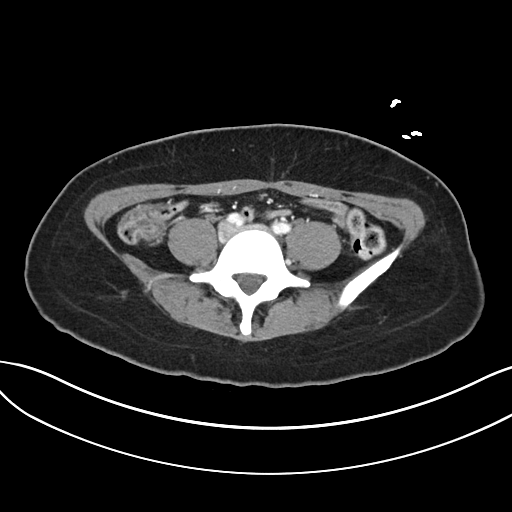
[im 47/93  soft-tissue]
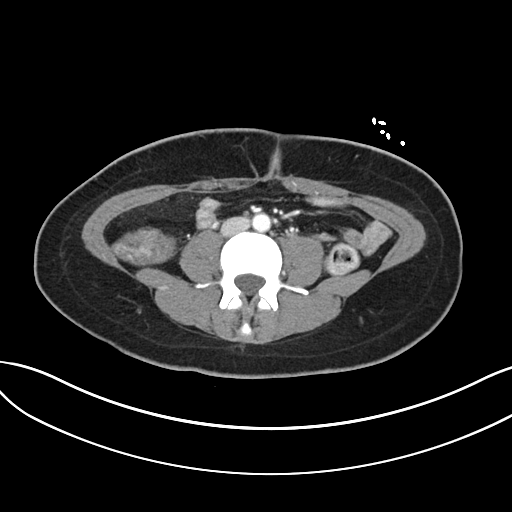
[im 54/93  soft-tissue]
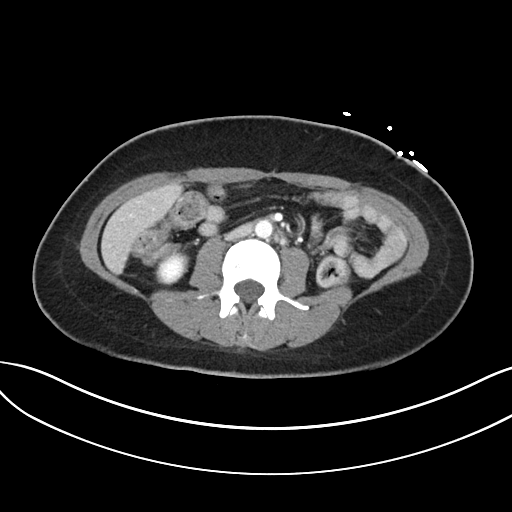
[im 62/93  soft-tissue]
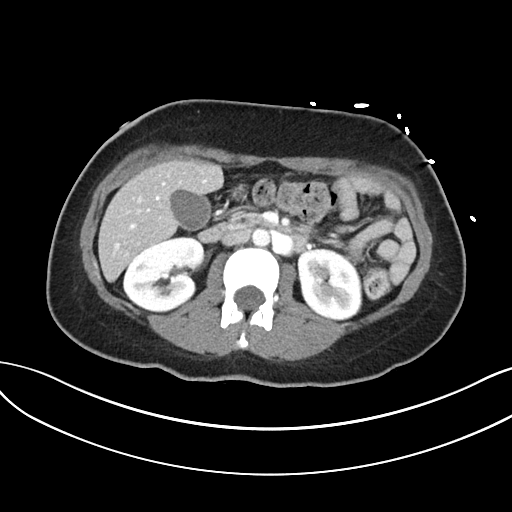
[im 70/93  soft-tissue]
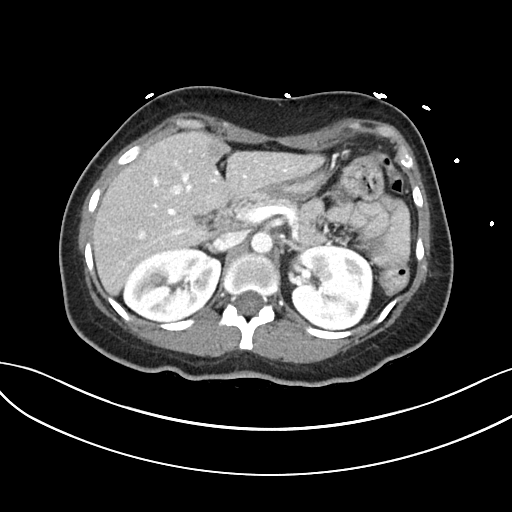
[im 70/93  bone]
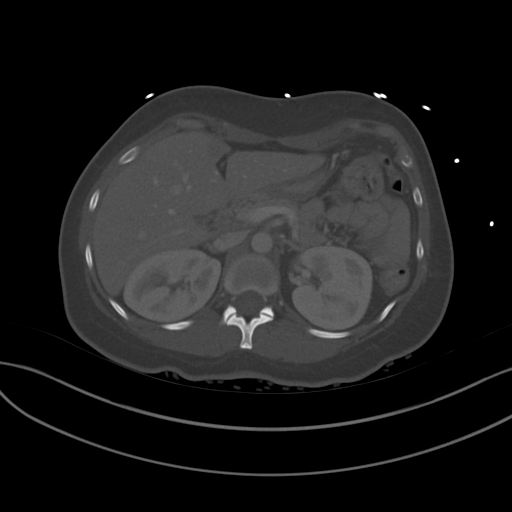
[im 77/93  soft-tissue]
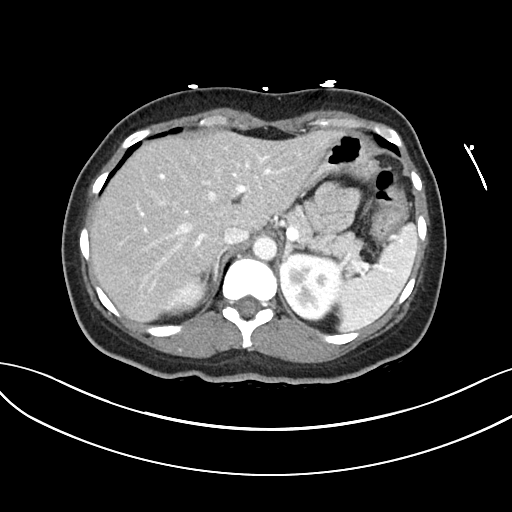
[im 85/93  soft-tissue]
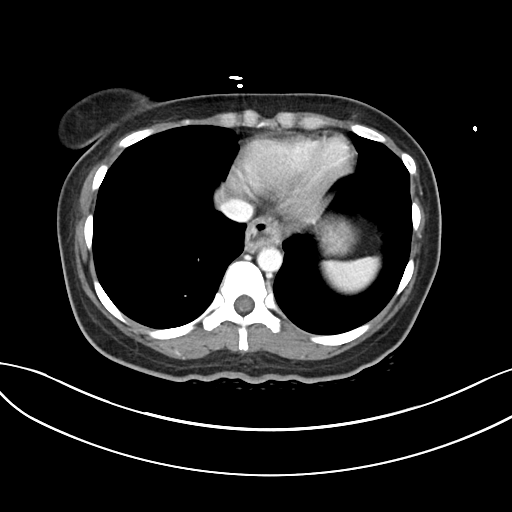

[Series 6: lung · axial · 0.76mm/px · z∈[+950,+968]mm · 2 of 54 slices shown]
[im 9/54  bone]
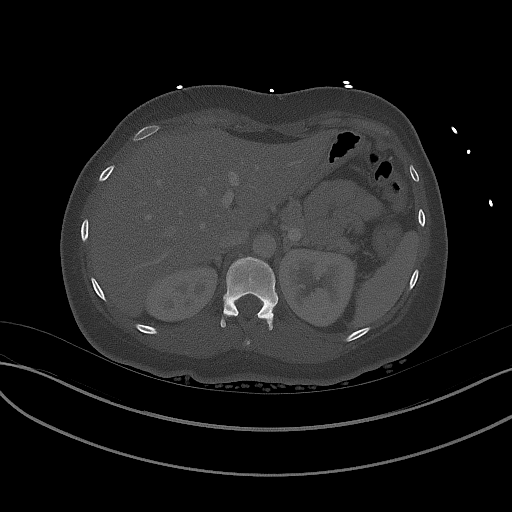
[im 18/54  bone]
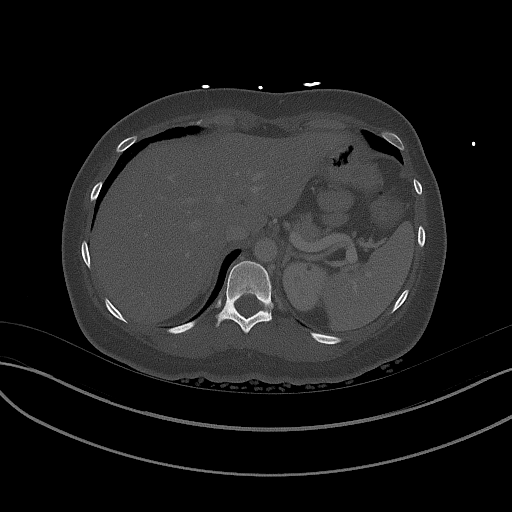

[Series 7: abdomen 3.0 mpr cor · coronal · 0.81mm/px · 3 of 82 slices shown]
[im 28/82  soft-tissue]
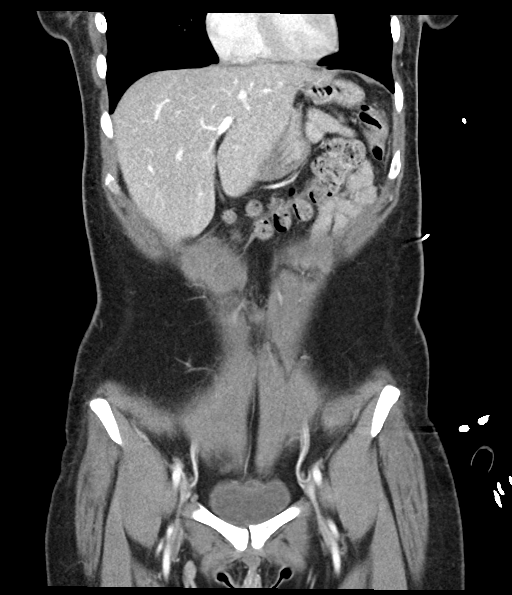
[im 37/82  soft-tissue]
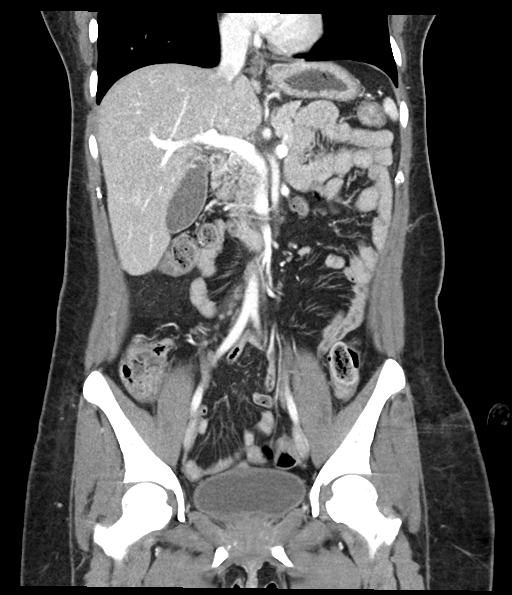
[im 46/82  soft-tissue]
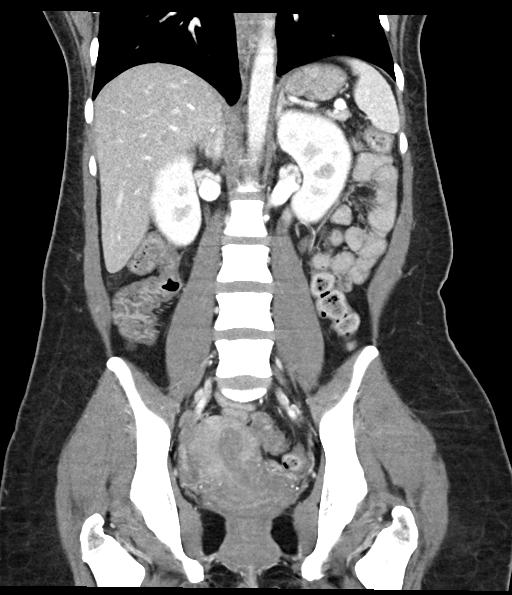

[16 of 46 positions shown; findings below may reference images not displayed]

FINDINGS: Lower chest: No acute abnormality.

Hepatobiliary: No focal liver abnormality is seen. Gallbladder
appears normal. No bile duct dilatation is seen.

Pancreas: Unremarkable. No pancreatic ductal dilatation or
surrounding inflammatory changes.

Spleen: Normal in size without focal abnormality.

Adrenals/Urinary Tract: Kidneys appear normal without stone or
hydronephrosis. No perinephric fluid. Adrenal glands appear normal.
No ureteral or bladder calculi are identified. Bladder appears
normal, partially decompressed.

Stomach/Bowel: No dilated large or small bowel loops. No evidence of
bowel wall inflammation. Appendix is normal. Stomach is
unremarkable, partially decompressed.

Vascular/Lymphatic: No vascular abnormality is identified. No
enlarged lymph nodes are seen within the abdomen or pelvis.

Reproductive: Uterine fibroid exophytic to the posterior fundus,
measuring 3 cm. Adnexal regions are unremarkable without mass or
free fluid.

Other: No free fluid or abscess collection. No free intraperitoneal
air.

Musculoskeletal: No acute or significant osseous findings.
IMPRESSION: 1. No acute findings within the abdomen or pelvis. No source for
abdominal pain identified. No bowel obstruction or evidence of bowel
wall inflammation. No evidence of acute solid organ abnormality. No
renal or ureteral calculi. Appendix is normal.
2. Uterine fibroid, measuring 3 cm.

## 2022-09-04 NOTE — Anesthesia Postprocedure Evaluation (Signed)
Anesthesia Post Note  Patient: [name]  Procedure(s) Performed: ULTRASOUND GUIDED  SUCTION DILATATION AND CURETTAGE,  EXCISION PROLASPED UTERINE FIBROID OPERATIVE ULTRASOUND HYSTEROSCOPY WITH [place] RESECTION OF UTERINE FIBROIDS     Patient location during evaluation: PACU Anesthesia Type: General Level of consciousness: awake and alert Pain management: pain level controlled Vital Signs Assessment: post-procedure vital signs reviewed and stable Respiratory status: spontaneous breathing, nonlabored ventilation, respiratory function stable and patient connected to nasal cannula oxygen Cardiovascular status: blood pressure returned to baseline and stable Postop Assessment: no apparent nausea or vomiting Anesthetic complications: no   No notable events documented.  Last Vitals:  Vitals:   08/30/22 0030 08/30/22 0045  BP: 120/63 108/78  Pulse: 89 90  Resp: 18 15  Temp:  36.4 C  SpO2: 98% 99%    Last Pain:  Vitals:   08/30/22 0045  TempSrc:   PainSc: Asleep                 Tiajuana Amass

## 2022-09-13 ENCOUNTER — Other Ambulatory Visit: Payer: Self-pay | Admitting: Obstetrics and Gynecology

## 2022-09-13 DIAGNOSIS — E049 Nontoxic goiter, unspecified: Secondary | ICD-10-CM

## 2022-10-02 ENCOUNTER — Ambulatory Visit: Payer: BC Managed Care – PPO | Admitting: Advanced Practice Midwife

## 2022-10-10 ENCOUNTER — Ambulatory Visit
Admission: RE | Admit: 2022-10-10 | Discharge: 2022-10-10 | Disposition: A | Discharge status: Home / Self Care | Payer: BC Managed Care – PPO | Source: Ambulatory Visit | Attending: Obstetrics and Gynecology | Admitting: Obstetrics and Gynecology

## 2022-10-10 DIAGNOSIS — E049 Nontoxic goiter, unspecified: Secondary | ICD-10-CM

## 2022-11-23 ENCOUNTER — Ambulatory Visit (HOSPITAL_BASED_OUTPATIENT_CLINIC_OR_DEPARTMENT_OTHER): Admit: 2022-11-23 | Payer: BC Managed Care – PPO | Admitting: Obstetrics and Gynecology

## 2022-11-23 ENCOUNTER — Encounter (HOSPITAL_BASED_OUTPATIENT_CLINIC_OR_DEPARTMENT_OTHER): Payer: Self-pay

## 2022-11-23 SURGERY — DILATATION & CURETTAGE/HYSTEROSCOPY WITH RESECTOCOPE
Anesthesia: General

## 2022-12-03 ENCOUNTER — Encounter (HOSPITAL_BASED_OUTPATIENT_CLINIC_OR_DEPARTMENT_OTHER): Payer: Self-pay | Admitting: Obstetrics and Gynecology

## 2022-12-03 NOTE — Progress Notes (Signed)
12/03/2022 4:14 PM Spoke w/ via phone for pre-op interview---patient Lab needs dos----I Stat Chem 8, CBG, Urine pregnancy Lab results----EKG 03/28/22, --CBC & BMET 08/29/22 in [place] test -----patient states asymptomatic no test needed Arrive at -------0530 NPO after MN NO Solid Food.  Clear liquids from MN until---0330 Med rec completed Medications to take morning of surgery -----none Diabetic medication -----HOLD SYNJARDY AM surgery Patient instructed no nail polish to be worn day of surgery Patient instructed to bring photo id and insurance card day of surgery Patient aware to have Driver (ride ) / caregiver    for 24 hours after surgery -Home with 42 year old daughter Nylah Patient Special Instructions -----advised to hold OTC herbs and supplements until after DOS (she is on multiple) Pre-Op special Istructions -----na Patient verbalized understanding of instructions that were given at this phone interview. Patient denies shortness of breath, chest pain, fever, cough at this phone interview.  Reese Stockman, Arville Lime

## 2022-12-07 ENCOUNTER — Ambulatory Visit (HOSPITAL_BASED_OUTPATIENT_CLINIC_OR_DEPARTMENT_OTHER): Payer: BC Managed Care – PPO | Admitting: Anesthesiology

## 2022-12-07 ENCOUNTER — Other Ambulatory Visit: Payer: Self-pay

## 2022-12-07 ENCOUNTER — Encounter (HOSPITAL_BASED_OUTPATIENT_CLINIC_OR_DEPARTMENT_OTHER)
Admission: RE | Disposition: A | Discharge status: Home / Self Care | Payer: Self-pay | Source: Home / Self Care | Attending: Obstetrics and Gynecology

## 2022-12-07 ENCOUNTER — Ambulatory Visit (HOSPITAL_BASED_OUTPATIENT_CLINIC_OR_DEPARTMENT_OTHER)
Admission: RE | Admit: 2022-12-07 | Discharge: 2022-12-07 | Disposition: A | Discharge status: Home / Self Care | Payer: BC Managed Care – PPO | Attending: Obstetrics and Gynecology | Admitting: Obstetrics and Gynecology

## 2022-12-07 ENCOUNTER — Encounter (HOSPITAL_BASED_OUTPATIENT_CLINIC_OR_DEPARTMENT_OTHER): Payer: Self-pay | Admitting: Obstetrics and Gynecology

## 2022-12-07 DIAGNOSIS — D250 Submucous leiomyoma of uterus: Secondary | ICD-10-CM | POA: Insufficient documentation

## 2022-12-07 DIAGNOSIS — N938 Other specified abnormal uterine and vaginal bleeding: Secondary | ICD-10-CM | POA: Diagnosis not present

## 2022-12-07 DIAGNOSIS — E119 Type 2 diabetes mellitus without complications: Secondary | ICD-10-CM

## 2022-12-07 HISTORY — DX: Cardiac murmur, unspecified: R01.1

## 2022-12-07 HISTORY — PX: DILATATION & CURRETTAGE/HYSTEROSCOPY WITH RESECTOCOPE: SHX5572

## 2022-12-07 LAB — POCT I-STAT, CHEM 8
BUN: 16 mg/dL (ref 6–20)
Calcium, Ion: 1.2 mmol/L (ref 1.15–1.40)
Chloride: 105 mmol/L (ref 98–111)
Creatinine, Ser: 0.6 mg/dL (ref 0.44–1.00)
Glucose, Bld: 128 mg/dL — ABNORMAL HIGH (ref 70–99)
HCT: 42 % (ref 36.0–46.0)
Hemoglobin: 14.3 g/dL (ref 12.0–15.0)
Potassium: 3.9 mmol/L (ref 3.5–5.1)
Sodium: 140 mmol/L (ref 135–145)
TCO2: 23 mmol/L (ref 22–32)

## 2022-12-07 LAB — POCT PREGNANCY, URINE: Preg Test, Ur: NEGATIVE

## 2022-12-07 LAB — GLUCOSE, CAPILLARY: Glucose-Capillary: 123 mg/dL — ABNORMAL HIGH (ref 70–99)

## 2022-12-07 SURGERY — DILATATION & CURETTAGE/HYSTEROSCOPY WITH RESECTOCOPE
Anesthesia: General

## 2022-12-07 MED ORDER — DEXAMETHASONE SODIUM PHOSPHATE 10 MG/ML IJ SOLN
INTRAMUSCULAR | Status: DC | PRN
  Administered 2022-12-07 (×2): 5 mg via INTRAVENOUS

## 2022-12-07 MED ORDER — SOD CITRATE-CITRIC ACID 500-334 MG/5ML PO SOLN: 30.0000 mL | ORAL | Status: DC

## 2022-12-07 MED ORDER — PROPOFOL 10 MG/ML IV BOLUS
INTRAVENOUS | Status: AC
  Filled 2022-12-07: qty 20

## 2022-12-07 MED ORDER — ONDANSETRON HCL 4 MG/2ML IJ SOLN
INTRAMUSCULAR | Status: DC | PRN
  Administered 2022-12-07: 4 mg via INTRAVENOUS

## 2022-12-07 MED ORDER — KETOROLAC TROMETHAMINE 30 MG/ML IJ SOLN
INTRAMUSCULAR | Status: AC
  Filled 2022-12-07: qty 1

## 2022-12-07 MED ORDER — SODIUM CHLORIDE 0.9 % IR SOLN
Status: DC | PRN
  Administered 2022-12-07: 3000 mL

## 2022-12-07 MED ORDER — ONDANSETRON HCL 4 MG/2ML IJ SOLN
INTRAMUSCULAR | Status: AC
  Filled 2022-12-07: qty 2

## 2022-12-07 MED ORDER — FENTANYL CITRATE (PF) 100 MCG/2ML IJ SOLN
INTRAMUSCULAR | Status: DC | PRN
  Administered 2022-12-07: 25 ug via INTRAVENOUS
  Administered 2022-12-07: 50 ug via INTRAVENOUS
  Administered 2022-12-07: 25 ug via INTRAVENOUS

## 2022-12-07 MED ORDER — ACETAMINOPHEN 500 MG PO TABS
1000.0000 mg | ORAL_TABLET | Freq: Once | ORAL | Status: AC
  Administered 2022-12-07: 1000 mg via ORAL

## 2022-12-07 MED ORDER — FENTANYL CITRATE (PF) 100 MCG/2ML IJ SOLN
INTRAMUSCULAR | Status: AC
  Filled 2022-12-07: qty 2

## 2022-12-07 MED ORDER — CEFAZOLIN SODIUM-DEXTROSE 2-4 GM/100ML-% IV SOLN
INTRAVENOUS | Status: AC
  Filled 2022-12-07: qty 100

## 2022-12-07 MED ORDER — POVIDONE-IODINE 10 % EX SWAB: 2.0000 | Freq: Once | CUTANEOUS | Status: DC

## 2022-12-07 MED ORDER — ACETAMINOPHEN 500 MG PO TABS
ORAL_TABLET | ORAL | Status: AC
  Filled 2022-12-07: qty 2

## 2022-12-07 MED ORDER — LACTATED RINGERS IV SOLN: INTRAVENOUS | Status: DC

## 2022-12-07 MED ORDER — MIDAZOLAM HCL 2 MG/2ML IJ SOLN
INTRAMUSCULAR | Status: AC
  Filled 2022-12-07: qty 2

## 2022-12-07 MED ORDER — FENTANYL CITRATE (PF) 100 MCG/2ML IJ SOLN
25.0000 ug | INTRAMUSCULAR | Status: DC | PRN
  Administered 2022-12-07 (×2): 50 ug via INTRAVENOUS

## 2022-12-07 MED ORDER — KETOROLAC TROMETHAMINE 30 MG/ML IJ SOLN
INTRAMUSCULAR | Status: DC | PRN
  Administered 2022-12-07: 30 mg via INTRAVENOUS

## 2022-12-07 MED ORDER — DEXAMETHASONE SODIUM PHOSPHATE 10 MG/ML IJ SOLN
INTRAMUSCULAR | Status: AC
  Filled 2022-12-07: qty 1

## 2022-12-07 MED ORDER — PROPOFOL 10 MG/ML IV BOLUS
INTRAVENOUS | Status: DC | PRN
  Administered 2022-12-07: 200 mg via INTRAVENOUS
  Administered 2022-12-07: 100 mg via INTRAVENOUS

## 2022-12-07 MED ORDER — MIDAZOLAM HCL 2 MG/2ML IJ SOLN
INTRAMUSCULAR | Status: DC | PRN
  Administered 2022-12-07: 2 mg via INTRAVENOUS

## 2022-12-07 MED ORDER — LIDOCAINE HCL (PF) 2 % IJ SOLN
INTRAMUSCULAR | Status: AC
  Filled 2022-12-07: qty 5

## 2022-12-07 MED ORDER — LIDOCAINE 2% (20 MG/ML) 5 ML SYRINGE
INTRAMUSCULAR | Status: DC | PRN
  Administered 2022-12-07: 60 mg via INTRAVENOUS

## 2022-12-07 SURGICAL SUPPLY — 21 items
CATH ROBINSON RED A/P 16FR (CATHETERS) IMPLANT
DEVICE MYOSURE LITE (MISCELLANEOUS) IMPLANT
DEVICE MYOSURE REACH (MISCELLANEOUS) IMPLANT
DILATOR CANAL MILEX (MISCELLANEOUS) IMPLANT
DRSG TELFA 3X8 NADH STRL (GAUZE/BANDAGES/DRESSINGS) ×1 IMPLANT
GAUZE 4X4 16PLY ~~LOC~~+RFID DBL (SPONGE) ×1 IMPLANT
GLOVE BIOGEL PI IND STRL 7.0 (GLOVE) ×1 IMPLANT
GLOVE ECLIPSE 6.5 STRL STRAW (GLOVE) ×1 IMPLANT
GOWN STRL REUS W/TWL LRG LVL3 (GOWN DISPOSABLE) ×1 IMPLANT
KIT PROCEDURE FLUENT (KITS) ×1 IMPLANT
KIT TURNOVER CYSTO (KITS) ×1 IMPLANT
LOOP CUTTING BIPOLAR 21FR (ELECTRODE) IMPLANT
MYOSURE XL FIBROID (MISCELLANEOUS)
PACK VAGINAL MINOR WOMEN LF (CUSTOM PROCEDURE TRAY) ×1 IMPLANT
PAD OB MATERNITY 4.3X12.25 (PERSONAL CARE ITEMS) ×1 IMPLANT
PAD PREP 24X48 CUFFED NSTRL (MISCELLANEOUS) ×1 IMPLANT
SEAL ROD LENS SCOPE MYOSURE (ABLATOR) ×1 IMPLANT
SLEEVE SCD COMPRESS KNEE MED (STOCKING) ×1 IMPLANT
SYSTEM TISS REMOVAL MYOSURE XL (MISCELLANEOUS) IMPLANT
TOWEL OR 17X24 6PK STRL BLUE (TOWEL DISPOSABLE) ×1 IMPLANT
WATER STERILE IRR 500ML POUR (IV SOLUTION) ×1 IMPLANT

## 2022-12-07 NOTE — Anesthesia Procedure Notes (Signed)
Procedure Name: LMA Insertion Date/Time: 12/07/2022 8:35 AM  Performed by: Suan Halter, CRNAPre-anesthesia Checklist: Patient identified, Emergency Drugs available, Suction available and Patient being monitored Patient Re-evaluated:Patient Re-evaluated prior to induction Oxygen Delivery Method: Circle system utilized Preoxygenation: Pre-oxygenation with 100% oxygen Induction Type: IV induction Ventilation: Mask ventilation without difficulty LMA: LMA inserted LMA Size: 4.0 Number of attempts: 1 Airway Equipment and Method: Bite block Placement Confirmation: positive ETCO2 Tube secured with: Tape Dental Injury: Teeth and Oropharynx as per pre-operative assessment

## 2022-12-07 NOTE — Anesthesia Preprocedure Evaluation (Addendum)
Anesthesia Evaluation  Patient identified by MRN, date of birth, ID band Patient awake    Reviewed: Allergy & Precautions, H&P , NPO status , Patient's Chart, lab work & pertinent test results  Airway Mallampati: II  TM Distance: >3 FB Neck ROM: Full    Dental no notable dental hx. (+) Teeth Intact, Dental Advisory Given   Pulmonary neg pulmonary ROS   Pulmonary exam normal breath sounds clear to auscultation       Cardiovascular negative cardio ROS  Rhythm:Regular Rate:Normal     Neuro/Psych   Anxiety     negative neurological ROS     GI/Hepatic negative GI ROS, Neg liver ROS,,,  Endo/Other  diabetes    Renal/GU negative Renal ROS  negative genitourinary   Musculoskeletal   Abdominal   Peds  Hematology  (+) Blood dyscrasia, anemia   Anesthesia Other Findings   Reproductive/Obstetrics negative OB ROS                             Anesthesia Physical Anesthesia Plan  ASA: 2  Anesthesia Plan: General   Post-op Pain Management: Tylenol PO (pre-op)* and Toradol IV (intra-op)*   Induction: Intravenous  PONV Risk Score and Plan: 4 or greater and Ondansetron, Dexamethasone and Midazolam  Airway Management Planned: LMA  Additional Equipment:   Intra-op Plan:   Post-operative Plan: Extubation in OR  Informed Consent: I have reviewed the patients History and Physical, chart, labs and discussed the procedure including the risks, benefits and alternatives for the proposed anesthesia with the patient or authorized representative who has indicated his/her understanding and acceptance.     Dental advisory given  Plan Discussed with: CRNA  Anesthesia Plan Comments:        Anesthesia Quick Evaluation

## 2022-12-07 NOTE — Interval H&P Note (Signed)
History and Physical Interval Note:  12/07/2022 7:59 AM  [name]  has presented today for surgery, with the diagnosis of submucosal fibroid, iron defiency anemia, abnormal uterine bleeding.  The various methods of treatment have been discussed with the patient and family. After consideration of risks, benefits and other options for treatment, the patient has consented to  Procedure: diagnostic hysteroscopy, hysteroscopic resection of submucosal fibroid using myosure, dilation and curettage as a surgical intervention.  The patient's history has been reviewed, patient examined, no change in status, stable for surgery.  I have reviewed the patient's chart and labs.  Questions were answered to the patient's satisfaction.     Lakaya Tolen A Mir Fullilove

## 2022-12-07 NOTE — Op Note (Unsigned)
[name], LAMARRE MEDICAL RECORD NO: [id number] ACCOUNT NO: [id number] DATE OF BIRTH: Mar 11, 1981 FACILITY: [facility] LOCATION: [facility] PHYSICIAN: Neelam Tiggs A. Garwin Brothers, MD  Operative Report   DATE OF PROCEDURE: 12/07/2022  PREOPERATIVE DIAGNOSES:  Abnormal uterine bleeding submucosal fibroid.  POSTOPERATIVE DIAGNOSIS:   Abnormal uterine bleeding submucosal fibroid.  PROCEDURE:  Diagnostic hysteroscopy, hysteroscopic resection of submucosal fibroid, dilation and curettage.  ANESTHESIA:  General.  SURGEON:  Dr. Servando Salina.  ASSISTANT:  None.  DESCRIPTION OF PROCEDURE:  Under adequate general anesthesia, the patient was placed in the dorsal lithotomy position.  She was sterilely prepped and draped in the usual fashion.  Patient had voided prior to entering the room.  Examination under  anesthesia revealed anteverted uterus, irregular.  No adnexal masses could be appreciated.  Bivalve speculum was placed in the vagina.  A single-tooth tenaculum was placed on the anterior lip of the cervix.  The cervix was serially dilated to #[facility]  dilator.  A diagnostic hysteroscope was carefully introduced into the uterine cavity.  Small intrauterine cavity was noted.  Both tubal ostias could be seen remnants of where the patient had a prolapsing fibroid have been removed  late last year  was noted.  Both sides of those areas were resected as well as the endometrial cavity.  When all tissue was felt to be removed, the endocervical cavity was inspected.  All instruments were then removed from the vagina.  Specimen labeled endometrial curettings with fibroid resection was sent to pathology.  ESTIMATED BLOOD LOSS:  10 mL  COMPLICATIONS:  None.  DISPOSITION:  The patient tolerated the procedure well, was transferred to recovery room in stable condition.     Elián.Darby D: 12/07/2022 10:06:06 am T: 12/07/2022 10:12:00 am  JOB: [id number]

## 2022-12-07 NOTE — Anesthesia Postprocedure Evaluation (Signed)
Anesthesia Post Note  Patient: LEXIANNA BREITZMAN  Procedure(s) Performed: DILATATION & CURETTAGE/HYSTEROSCOPY WITH RESECTOCOPE     Patient location during evaluation: PACU Anesthesia Type: General Level of consciousness: awake and alert Pain management: pain level controlled Vital Signs Assessment: post-procedure vital signs reviewed and stable Respiratory status: spontaneous breathing, nonlabored ventilation and respiratory function stable Cardiovascular status: blood pressure returned to baseline and stable Postop Assessment: no apparent nausea or vomiting Anesthetic complications: no  No notable events documented.  Last Vitals:  Vitals:   12/07/22 1015 12/07/22 1040  BP: (!) 144/80 (!) 148/92  Pulse: 76 91  Resp: 15 17  Temp: 36.6 C (!) 36.3 C  SpO2: 96% 97%    Last Pain:  Vitals:   12/07/22 1040  TempSrc:   PainSc: 3                  Germaine Ripp,W. EDMOND

## 2022-12-07 NOTE — Transfer of Care (Signed)
Immediate Anesthesia Transfer of Care Note  Patient: [name]  Procedure(s) Performed: Procedure(s) (LRB): DILATATION & CURETTAGE/HYSTEROSCOPY WITH RESECTOCOPE (N/A)  Patient Location: PACU  Anesthesia Type: General  Level of Consciousness: awake, oriented, sedated and patient cooperative  Airway & Oxygen Therapy: Patient Spontanous Breathing and Patient connected to face mask oxygen  Post-op Assessment: Report given to PACU RN and Post -op Vital signs reviewed and stable  Post vital signs: Reviewed and stable  Complications: No apparent anesthesia complications  Last Vitals:  Vitals Value Taken Time  BP 131/78 12/07/22 0908  Temp    Pulse 92 12/07/22 0909  Resp 22 12/07/22 0909  SpO2 100 % 12/07/22 0909  Vitals shown include unvalidated device data.  Last Pain:  Vitals:   12/07/22 0559  TempSrc: Oral  PainSc: 0-No pain      Patients Stated Pain Goal: 9 ([id number])  Complications: No notable events documented.

## 2022-12-07 NOTE — Discharge Instructions (Addendum)
CALL  IF TEMP>100.4, NOTHING PER VAGINA X 2 WK, CALL IF SOAKING A MAXI  PAD EVERY HOUR OR MORE FREQUENTLY Post Anesthesia Home Care Instructions  Activity: Get plenty of rest for the remainder of the day. A responsible adult should stay with you for 24 hours following the procedure.  For the next 24 hours, DO NOT: -Drive a car -Operate machinery -Drink alcoholic beverages -Take any medication unless instructed by your physician -Make any legal decisions or sign important papers.  Meals: Start with liquid foods such as gelatin or soup. Progress to regular foods as tolerated. Avoid greasy, spicy, heavy foods. If nausea and/or vomiting occur, drink only clear liquids until the nausea and/or vomiting subsides. Call your physician if vomiting continues.  Special Instructions/Symptoms: Your throat may feel dry or sore from the anesthesia or the breathing tube placed in your throat during surgery. If this causes discomfort, gargle with warm salt water. The discomfort should disappear within 24 hours.    

## 2022-12-07 NOTE — H&P (Signed)
[name] is an 42 y.o. female. BF presents for surgical management of AUB, SM fibroids via dx hysteroscopy, D&C, resection of SM fibroid. PMH notable for excision of prolapsing uterine fibroid 12/6 with subsequent ongoing vaginal bleeding. Pt was also treated for presumed ectopic pregnancy during that time  Pertinent Gynecological History: Menses:  dub Bleeding: dysfunctional uterine bleeding Contraception: none DES exposure: denies Blood transfusions: none Sexually transmitted diseases: no past history Previous GYN Procedures:  excision of prolapsing fibroid, dx hysteroscopy   Last mammogram: normal Date: 2023 Last pap: normal Date: 2024 OB History: G2, P1011   Menstrual History: Menarche age: n/a Patient's last menstrual period was 11/04/2022 (approximate).    Past Medical History:  Diagnosis Date   Anemia    Anxiety    Diabetes mellitus without complication ([place])    last A1C 5.1   Heart murmur    was seen by cardiology, benign, no follow up needed   Onychomycosis     Past Surgical History:  Procedure Laterality Date   DILATATION & CURETTAGE/HYSTEROSCOPY WITH MYOSURE  08/29/2022   Procedure: HYSTEROSCOPY WITH MYOSURE RESECTION OF UTERINE FIBROIDS;  Surgeon: Servando Salina, MD;  Location: [facility];  Service: Gynecology;;   DILATION AND CURETTAGE OF UTERUS N/A 08/29/2022   Procedure: ULTRASOUND GUIDED  SUCTION DILATATION AND CURETTAGE,  EXCISION PROLASPED UTERINE FIBROID;  Surgeon: Servando Salina, MD;  Location: [facility];  Service: Gynecology;  Laterality: N/A;   NO PAST SURGERIES     OPERATIVE ULTRASOUND N/A 08/29/2022   Procedure: OPERATIVE ULTRASOUND;  Surgeon: Servando Salina, MD;  Location: [facility];  Service: Gynecology;  Laterality: N/A;    Family History  Problem Relation Age of Onset   Hypertension Mother    Diabetes Mother    Diabetes Maternal Grandmother     Social History:  reports that she has never smoked. She has never used smokeless tobacco. She  reports current alcohol use. She reports that she does not use drugs.  Allergies: No Known Allergies  Medications Prior to Admission  Medication Sig Dispense Refill Last Dose   Ascorbic Acid (VITAMIN C) 100 MG tablet Take 100 mg by mouth daily.   12/04/2022   BLACK CURRANT SEED OIL PO Take by mouth.   12/04/2022   cholecalciferol (VITAMIN D3) 25 MCG (1000 UNIT) tablet Take 1,000 Units by mouth daily.   12/04/2022   Elderberry 500 MG CAPS Take by mouth.   12/04/2022   ferrous sulfate 325 (65 FE) MG tablet TAKE 1 TABLET BY MOUTH TWICE A DAY 180 tablet 1 12/06/2022   magnesium gluconate (MAGONATE) 500 MG tablet Take 500 mg by mouth daily.   12/04/2022   metFORMIN (GLUCOPHAGE) 500 MG tablet SMARTSIG:1 Tablet(s) By Mouth Every Evening   12/06/2022   NON FORMULARY soursop   12/04/2022   norethindrone (MICRONOR) 0.35 MG tablet Take 1 tablet by mouth daily.   12/06/2022   Omega-3 Fatty Acids (FISH OIL PO) Take 2 tablets by mouth daily. 2 grams daily   12/04/2022   SYNJARDY XR 12.01-999 MG TB24 Take 1 tablet by mouth every morning.   12/06/2022   tretinoin (RETIN-A) 0.025 % cream Apply topically at bedtime.   12/06/2022   ibuprofen (ADVIL) 800 MG tablet Take 1 tablet (800 mg total) by mouth every 8 (eight) hours as needed for mild pain or moderate pain. 30 tablet 5     Review of Systems  All other systems reviewed and are negative.   Blood pressure (!) 156/101, pulse 88, temperature 97.6  F (36.4 C), temperature source Oral, resp. rate 18, height 5\' 5"  (1.651 m), weight 76.7 kg, last menstrual period 11/04/2022, SpO2 100 %, unknown if currently breastfeeding. Physical Exam Constitutional:      Appearance: Normal appearance.  HENT:     Left Ear: Tympanic membrane normal.  Eyes:     Extraocular Movements: Extraocular movements intact.  Cardiovascular:     Rate and Rhythm: Regular rhythm.     Heart sounds: Normal heart sounds.  Pulmonary:     Breath sounds: Normal breath sounds.  Abdominal:      Palpations: Abdomen is soft.  Genitourinary:    Comments: Vulva nl  Vagina nl Cervix parous.  Uterus anteverted irregular Adnexa nl Musculoskeletal:     Cervical back: Neck supple.  Skin:    General: Skin is dry.  Neurological:     Mental Status: She is alert and oriented to person, place, and time.  Psychiatric:        Mood and Affect: Mood normal.        Behavior: Behavior normal.     Results for orders placed or performed during the hospital encounter of 12/07/22 (from the past 24 hour(s))  Pregnancy, urine POC     Status: None   Collection Time: 12/07/22  5:39 AM  Result Value Ref Range   Preg Test, Ur NEGATIVE NEGATIVE  I-STAT, chem 8     Status: Abnormal   Collection Time: 12/07/22  6:19 AM  Result Value Ref Range   Sodium 140 135 - 145 mmol/L   Potassium 3.9 3.5 - 5.1 mmol/L   Chloride 105 98 - 111 mmol/L   BUN 16 6 - 20 mg/dL   Creatinine, Ser 0.60 0.44 - 1.00 mg/dL   Glucose, Bld 128 (H) 70 - 99 mg/dL   Calcium, Ion 1.20 1.15 - 1.40 mmol/L   TCO2 23 22 - 32 mmol/L   Hemoglobin 14.3 12.0 - 15.0 g/dL   HCT 42.0 36.0 - 46.0 %    No results found.  Assessment/Plan: DUB SM fibroids DM P) dx hysteroscopy, hysteroscopic resection of sm fibroid using myosure, D&C. Procedure explained. Risk of surgery reviewed including infection, bleeding, injury to surrounding organ structures, fluid overload, thermal injury , uterine perforation and its mgmt. All ? answered  Ronnell Makarewicz A Eldene Plocher 12/07/2022, 6:54 AM

## 2022-12-07 NOTE — Brief Op Note (Signed)
12/07/2022  9:12 AM  PATIENT:  [name]  42 y.o. female  PRE-OPERATIVE DIAGNOSIS:  submucosal fibroid, iron defiency anemia, abnormal uterine bleeding  POST-OPERATIVE DIAGNOSIS:  submucosal fibroid, iron defiency anemia, abnormal uterine bleeding  PROCEDURE:  diagnostic hysteroscopy, hysteroscopic resection of submucosal fibroid using Myosure, dilation and curettage  SURGEON:  Surgeon(s) and Role:    * Servando Salina, MD - Primary  PHYSICIAN ASSISTANT:   ASSISTANTS: none   ANESTHESIA:   general  EBL:  5 mL   BLOOD ADMINISTERED:none  DRAINS: none   LOCAL MEDICATIONS USED:  NONE  SPECIMEN:  Source of Specimen:  emc with fibroid resection  DISPOSITION OF SPECIMEN:  PATHOLOGY  COUNTS:  YES  TOURNIQUET:  * No tourniquets in log *  DICTATION: .Other Dictation: Dictation Number [id number]  PLAN OF CARE: Discharge to home after PACU  PATIENT DISPOSITION:  PACU - hemodynamically stable.   Delay start of Pharmacological VTE agent (>24hrs) due to surgical blood loss or risk of bleeding: no

## 2022-12-10 ENCOUNTER — Encounter (HOSPITAL_BASED_OUTPATIENT_CLINIC_OR_DEPARTMENT_OTHER): Payer: Self-pay | Admitting: Obstetrics and Gynecology

## 2022-12-10 LAB — SURGICAL PATHOLOGY

## 2023-05-20 ENCOUNTER — Other Ambulatory Visit: Payer: Self-pay | Admitting: Physician Assistant

## 2023-05-20 DIAGNOSIS — Z0000 Encounter for general adult medical examination without abnormal findings: Secondary | ICD-10-CM

## 2023-05-22 ENCOUNTER — Ambulatory Visit: Admission: RE | Admit: 2023-05-22 | Payer: BC Managed Care – PPO | Source: Ambulatory Visit

## 2023-05-22 DIAGNOSIS — Z0000 Encounter for general adult medical examination without abnormal findings: Secondary | ICD-10-CM

## 2024-01-29 ENCOUNTER — Ambulatory Visit (INDEPENDENT_AMBULATORY_CARE_PROVIDER_SITE_OTHER): Admitting: Podiatry

## 2024-01-29 ENCOUNTER — Encounter: Payer: Self-pay | Admitting: Podiatry

## 2024-01-29 DIAGNOSIS — B351 Tinea unguium: Secondary | ICD-10-CM

## 2024-01-29 DIAGNOSIS — M21621 Bunionette of right foot: Secondary | ICD-10-CM | POA: Diagnosis not present

## 2024-01-29 DIAGNOSIS — M79671 Pain in right foot: Secondary | ICD-10-CM | POA: Diagnosis not present

## 2024-01-29 DIAGNOSIS — M79672 Pain in left foot: Secondary | ICD-10-CM

## 2024-01-29 DIAGNOSIS — L84 Corns and callosities: Secondary | ICD-10-CM | POA: Diagnosis not present

## 2024-01-29 DIAGNOSIS — L600 Ingrowing nail: Secondary | ICD-10-CM

## 2024-01-29 DIAGNOSIS — M79675 Pain in left toe(s): Secondary | ICD-10-CM | POA: Diagnosis not present

## 2024-01-29 DIAGNOSIS — M79674 Pain in right toe(s): Secondary | ICD-10-CM | POA: Diagnosis not present

## 2024-01-29 NOTE — Progress Notes (Signed)
 Physical exam:  General appearance: Pleasant, and in no acute distress. AOx3.  Vascular: Pedal pulses: DP palpable bilaterally, PT palpable bilaterally.  No edema lower legs bilaterally. Capillary fill time mediate.  Neurological: Light touch intact feet bilaterally.  Normal Achilles reflex bilaterally.  No clonus or spasticity noted.   Dermatologic:   Painful onychomycotic nails with discoloration and subungual debris 1 through 5 bilaterally with redness along the nail folds.  Tender hyperkeratotic lesion plantar aspect fifth metatarsal phalangeal joint right and also on the dorsal medial aspect.  Skin normal temperature bilaterally.  Skin normal color, tone, and texture bilaterally.   Musculoskeletal: Tailor's bunion deformity right foot.  Tenderness with range of motion fifth MTP right.  Potation noted.  Radiographs: None  Diagnosis: 1.  Ingrown nails 1 through 5 bilaterally. 2.  Painful onychomycosis 1 through 5 bilaterally. 3.  Painful hyperkeratotic lesion right foot.  Plan: -Established office visit today level 3.  Modifier 25.  Visit visit for separate identifiable diagnoses.  Discussed with her onychomycosis and its treatment options.  Discussed over the risk and benefits of Lamisil for treatment of onychomycosis.  Patient wishes wishes to proceed with Lamisil.  Discussed with patient tailor's bunion deformities and possible surgical correction.  Patient is not ready to pursue surgery at this point but I will let her know what her options were and if it becomes more painful that would be the best course. -Venipuncture today for labs for LFTs. - Prescription Lamisil 250 mg p.o. daily - Debrided hyperkeratotic lesion plantar aspect of right foot.  Return in 4 weeks for Lamisil No. 2

## 2024-02-05 LAB — HEPATIC FUNCTION PANEL
ALT: 21 IU/L (ref 0–32)
AST: 16 IU/L (ref 0–40)
Albumin: 4.6 g/dL (ref 3.9–4.9)
Alkaline Phosphatase: 67 IU/L (ref 44–121)
Bilirubin Total: 0.2 mg/dL (ref 0.0–1.2)
Bilirubin, Direct: 0.1 mg/dL (ref 0.00–0.40)
Total Protein: 7.4 g/dL (ref 6.0–8.5)

## 2024-02-06 ENCOUNTER — Other Ambulatory Visit: Payer: Self-pay

## 2024-02-06 MED ORDER — TERBINAFINE HCL 250 MG PO TABS: 250.0000 mg | ORAL_TABLET | Freq: Every day | ORAL | 0 refills | Status: DC

## 2024-02-11 ENCOUNTER — Other Ambulatory Visit: Payer: Self-pay

## 2024-02-11 MED ORDER — TERBINAFINE HCL 250 MG PO TABS: 250.0000 mg | ORAL_TABLET | Freq: Every day | ORAL | 0 refills | Status: DC

## 2024-02-11 MED ORDER — TERBINAFINE HCL 250 MG PO TABS: 250.0000 mg | ORAL_TABLET | Freq: Every day | ORAL | 0 refills | Status: AC

## 2024-03-04 ENCOUNTER — Ambulatory Visit (INDEPENDENT_AMBULATORY_CARE_PROVIDER_SITE_OTHER): Admitting: Podiatry

## 2024-03-04 ENCOUNTER — Encounter: Payer: Self-pay | Admitting: Podiatry

## 2024-03-04 ENCOUNTER — Other Ambulatory Visit: Payer: Self-pay | Admitting: Podiatry

## 2024-03-04 DIAGNOSIS — B351 Tinea unguium: Secondary | ICD-10-CM

## 2024-03-04 DIAGNOSIS — M79672 Pain in left foot: Secondary | ICD-10-CM | POA: Diagnosis not present

## 2024-03-04 DIAGNOSIS — M79671 Pain in right foot: Secondary | ICD-10-CM

## 2024-03-04 MED ORDER — TERBINAFINE HCL 250 MG PO TABS: 250.0000 mg | ORAL_TABLET | Freq: Every day | ORAL | 0 refills | Status: DC

## 2024-03-04 NOTE — Progress Notes (Signed)
   Subjective:    HPI Presents for follow-up onychomycosis treatment with p.o. Lamisil.  No problems taking medicine with no side effects noted.  Tenderness around toes with walking and wearing shoes.   Objective:  Physical Exam   General: AAO x3, NAD  Vascular: DP and PT pulses palpable bilaterally.  Immedate capillary fill time digits. No significant lower extremity edema bilaterally.  Dermatological: Onychomycotic mycotic changes nails 1 through 5 with discoloration nail and subungual debris and thickening of the nail. 0% Clearance of onychomycotic nail changes noted. Tenderness with walking and wearing shoes.  Neruologic: Grossly intact B/L  Musculoskeletal: +   Assessment:  Painful onychomycotic nails 1 through 5 bilaterally. Pain feet b/l     Plan:  -Established office visit level 3 for evaluation and management -Patient is tolerating Lamisil treatment for onychomycotic nails well.  No side effects noted. Will continue this treatment. -Rx: Lamisil 250 mg p.o. daily - Labs ordered today for liver function tests to monitor for any hepatic side effects from the Lamisil.  - Return in 4 weeks for  Lamisil 3

## 2024-03-04 NOTE — Addendum Note (Signed)
 Addended by: Reche Canales on: 03/04/2024 04:38 PM   Modules accepted: Orders

## 2024-03-05 LAB — HEPATIC FUNCTION PANEL
ALT: 18 IU/L (ref 0–32)
AST: 14 IU/L (ref 0–40)
Albumin: 4.4 g/dL (ref 3.9–4.9)
Alkaline Phosphatase: 62 IU/L (ref 44–121)
Bilirubin Total: 0.2 mg/dL (ref 0.0–1.2)
Bilirubin, Direct: 0.1 mg/dL (ref 0.00–0.40)
Total Protein: 7.2 g/dL (ref 6.0–8.5)

## 2024-04-01 ENCOUNTER — Other Ambulatory Visit: Payer: Self-pay | Admitting: Podiatry

## 2024-04-01 ENCOUNTER — Encounter: Payer: Self-pay | Admitting: Podiatry

## 2024-04-01 ENCOUNTER — Ambulatory Visit (INDEPENDENT_AMBULATORY_CARE_PROVIDER_SITE_OTHER): Admitting: Podiatry

## 2024-04-01 DIAGNOSIS — M79671 Pain in right foot: Secondary | ICD-10-CM | POA: Diagnosis not present

## 2024-04-01 DIAGNOSIS — M79672 Pain in left foot: Secondary | ICD-10-CM

## 2024-04-01 DIAGNOSIS — B351 Tinea unguium: Secondary | ICD-10-CM

## 2024-04-01 MED ORDER — TERBINAFINE HCL 250 MG PO TABS
250.0000 mg | ORAL_TABLET | Freq: Every day | ORAL | 0 refills | Status: AC
Start: 2024-04-01 — End: 2024-05-01

## 2024-04-01 NOTE — Progress Notes (Signed)
   Subjective:    HPI Presents for follow-up onychomycosis treatment with p.o. Lamisil .  No problems taking medicine with no side effects noted.  Tenderness around toes with walking and wearing shoes.   Objective:  Physical Exam   General: AAO x3, NAD  Vascular: DP and PT pulses palpable bilaterally.  Immedate capillary fill time digits. No significant lower extremity edema bilaterally.  Dermatological: Onychomycotic mycotic changes nails 1 through 5 with discoloration nail and subungual debris and thickening of the nail. 10% Clearance of onychomycotic nail changes noted. Tenderness with walking and wearing shoes.  Neruologic: Grossly intact B/L  Musculoskeletal:   Assessment:  Painful onychomycotic nails 1 through 5 bilaterally. Pain feet b/l     Plan:  -Established office visit level 3 for evaluation and management -Patient is tolerating Lamisil  treatment for onychomycotic nails well.  No side effects noted. Will continue this treatment. -Rx: Lamisil  250 mg p.o. daily - Labs ordered today for liver function tests to monitor for any hepatic side effects from the Lamisil .  - Return in 4 weeks for  Lamisil  Final

## 2024-04-01 NOTE — Addendum Note (Signed)
 Addended by: GIB MABLE SAUNDERS on: 04/01/2024 04:37 PM   Modules accepted: Orders

## 2024-04-02 LAB — HEPATIC FUNCTION PANEL
ALT: 44 IU/L — ABNORMAL HIGH (ref 0–32)
AST: 31 IU/L (ref 0–40)
Albumin: 4.7 g/dL (ref 3.9–4.9)
Alkaline Phosphatase: 68 IU/L (ref 44–121)
Bilirubin Total: 0.3 mg/dL (ref 0.0–1.2)
Bilirubin, Direct: 0.12 mg/dL (ref 0.00–0.40)
Total Protein: 8.1 g/dL (ref 6.0–8.5)

## 2024-04-08 ENCOUNTER — Encounter: Payer: Self-pay | Admitting: Internal Medicine

## 2024-04-24 ENCOUNTER — Ambulatory Visit (INDEPENDENT_AMBULATORY_CARE_PROVIDER_SITE_OTHER): Admitting: Podiatry

## 2024-04-24 DIAGNOSIS — B070 Plantar wart: Secondary | ICD-10-CM

## 2024-04-24 DIAGNOSIS — M79671 Pain in right foot: Secondary | ICD-10-CM

## 2024-04-24 DIAGNOSIS — M79672 Pain in left foot: Secondary | ICD-10-CM

## 2024-04-24 DIAGNOSIS — B351 Tinea unguium: Secondary | ICD-10-CM

## 2024-04-24 NOTE — Progress Notes (Signed)
   Subjective:    HPI Presents for follow-up onychomycosis treatment with p.o. Lamisil .  No problems taking medicine with no side effects noted.  Tenderness around toes with walking and wearing shoes.  She says it looks like the nails are improving.  Also has a new complaint today of pain on the plantar lateral aspect of the left foot.  She has noticed a small bump developing there that gets sore when she is walking and wearing shoes.  Does not recall any injury to the area.   Objective:  Physical Exam   General: AAO x3, NAD  Vascular: DP and PT pulses palpable bilaterally.  Immedate capillary fill time digits. No significant lower extremity edema bilaterally.  Dermatological: Verrucous lesion plantar lateral foot left with skin lines going around the lesion's.  Tenderness with lateral pressure on the lesion.  Onychomycotic mycotic changes nails 1 through 5 with discoloration nail and subungual debris and thickening of the nail. 10% Clearance of onychomycotic nail changes noted. Tenderness with walking and wearing shoes.  Neruologic: Grossly intact B/L  Musculoskeletal:   Assessment:  Painful onychomycotic nails 1 through 5 bilaterally. Pain feet b/l Plantar verruca left foot     Plan:  -Established office visit level 3 for evaluation and management.  Modifier 25 -Visit today separate from procedure done to manage Lamisil  treatment of onychomycosis.  Ordered labs today for LFTs patient is tolerating Lamisil  treatment for onychomycotic nails well.  No side effects noted..  Lamisil  appears to be working at clearing the fungal fungus from the nail.  Told to keep an eye on the nails if they continue to grow out clearly probably in another 7 to 9 months, no need for follow-up appointment.  However, if it appears that is coming back were not clearing completely she should call for an appoint - Labs ordered today for liver function tests to monitor for any hepatic side effects from the  Lamisil .  -Applied Salinocaine compound to lesion(s) as noted in physical exam after debriding lesions to pinpoint bleeding.  Salinocaine applied to lesion(s) and covered with an occlusive dressing with Coban wrap.  Written and oral instructions given to patient. -Return in 2 weeks for follow-up and reevaluation.   - Return prn

## 2024-05-15 ENCOUNTER — Encounter: Payer: Self-pay | Admitting: Podiatry

## 2024-05-15 ENCOUNTER — Ambulatory Visit (INDEPENDENT_AMBULATORY_CARE_PROVIDER_SITE_OTHER): Admitting: Podiatry

## 2024-05-15 VITALS — Ht 65.0 in | Wt 170.0 lb

## 2024-05-15 DIAGNOSIS — B351 Tinea unguium: Secondary | ICD-10-CM | POA: Diagnosis not present

## 2024-05-15 DIAGNOSIS — B070 Plantar wart: Secondary | ICD-10-CM | POA: Diagnosis not present

## 2024-05-15 NOTE — Progress Notes (Signed)
 Patient presents for final Lamisil .  Says the nail seems to be clearing.  Also still having a little bit of soreness where the verrucous lesion is on the left foot.   Physical exam:  General appearance: Pleasant, and in no acute distress. AOx3.  Vascular: Pedal pulses: DP 2/4 bilaterally, PT 2/4 bilaterally. Capillary fill time immediate.  Neurological: Grossly intact bilaterally  Dermatologic:   Verrucous lesion plantar foot improved almost resolved.  Thick dystrophic discolored nails with subungual debris 1 through 5 bilaterally.  10% clearance of onychomycotic changes of nail plates..  Tenderness with some pressure of the nails.  Normal temperature bilaterally.  Skin normal color, tone, and texture bilaterally.   Musculoskeletal:      Diagnosis: 1.  Plantar verruca left. 2.  Onychomycosis aid 1 through 5 bilaterally  Plan: -Improved with clearance of the base of the mycotic nails 1 through 5 bilaterally.  He still has a few Lamisil  remaining and she should finish these out. -Order labs LFTs  -Applied Salinocaine compound to lesion(s) as noted in physical exam after debriding lesions to pinpoint bleeding.  Salinocaine applied to lesion(s) and covered with an occlusive dressing with Coban wrap.  Written and oral instructions given to patient.  -Return in 2 weeks for follow-up wart left

## 2024-05-20 ENCOUNTER — Other Ambulatory Visit: Payer: Self-pay | Admitting: Podiatry

## 2024-05-21 LAB — HEPATIC FUNCTION PANEL
ALT: 28 IU/L (ref 0–32)
AST: 18 IU/L (ref 0–40)
Albumin: 4.7 g/dL (ref 3.9–4.9)
Alkaline Phosphatase: 61 IU/L (ref 44–121)
Bilirubin Total: 0.4 mg/dL (ref 0.0–1.2)
Bilirubin, Direct: 0.14 mg/dL (ref 0.00–0.40)
Total Protein: 7.8 g/dL (ref 6.0–8.5)

## 2024-06-01 ENCOUNTER — Encounter: Payer: Self-pay | Admitting: Podiatry

## 2024-06-01 ENCOUNTER — Ambulatory Visit (INDEPENDENT_AMBULATORY_CARE_PROVIDER_SITE_OTHER): Admitting: Podiatry

## 2024-06-01 DIAGNOSIS — B070 Plantar wart: Secondary | ICD-10-CM

## 2024-06-01 NOTE — Progress Notes (Signed)
 Patient presents with complaint of wart on the plantar aspect of the left foot she has had 2 Salinocaine treatments that is doing much better but still feels like there is a little lump there.   Physical exam:  General appearance: Pleasant, and in no acute distress. AOx3.  Vascular: Pedal pulses: DP 2/4 bilaterally, PT 2/4 bilaterally.  No edema lower legs bilaterally. Capillary fill time immediate.  Neurological:   Dermatologic:   Small verrucous lesion about 2 mm in diameter plantar lateral foot left.  Skin lines go around the lesion.  Skin normal temperature bilaterally.  Skin normal color, tone, and texture bilaterally.   Musculoskeletal:    Diagnosis: 1.  Plantar verruca left.  Plan: -Applied Salinocaine compound to lesion(s) as noted in physical exam after debriding lesions to pinpoint bleeding.  Salinocaine applied to lesion(s) and covered with an occlusive dressing with Coban wrap.  Written and oral instructions given to patient.     Return 2 weeks follow-up verruca left.  If still present we will consider biopsy.

## 2024-06-04 ENCOUNTER — Other Ambulatory Visit: Payer: Self-pay | Admitting: Internal Medicine

## 2024-06-04 DIAGNOSIS — Z1231 Encounter for screening mammogram for malignant neoplasm of breast: Secondary | ICD-10-CM

## 2024-06-05 ENCOUNTER — Ambulatory Visit: Admitting: Cardiology

## 2024-06-05 ENCOUNTER — Ambulatory Visit
Admission: RE | Admit: 2024-06-05 | Discharge: 2024-06-05 | Disposition: A | Discharge status: Home / Self Care | Source: Ambulatory Visit | Attending: Internal Medicine | Admitting: Internal Medicine

## 2024-06-05 DIAGNOSIS — Z1231 Encounter for screening mammogram for malignant neoplasm of breast: Secondary | ICD-10-CM

## 2024-06-08 ENCOUNTER — Ambulatory Visit (INDEPENDENT_AMBULATORY_CARE_PROVIDER_SITE_OTHER): Admitting: Cardiology

## 2024-06-08 ENCOUNTER — Encounter: Payer: Self-pay | Admitting: Cardiology

## 2024-06-08 VITALS — BP 120/78 | HR 95 | Ht 65.0 in | Wt 155.6 lb

## 2024-06-08 DIAGNOSIS — H9190 Unspecified hearing loss, unspecified ear: Secondary | ICD-10-CM | POA: Insufficient documentation

## 2024-06-08 DIAGNOSIS — E119 Type 2 diabetes mellitus without complications: Secondary | ICD-10-CM | POA: Diagnosis not present

## 2024-06-08 DIAGNOSIS — E782 Mixed hyperlipidemia: Secondary | ICD-10-CM | POA: Insufficient documentation

## 2024-06-08 DIAGNOSIS — Z1329 Encounter for screening for other suspected endocrine disorder: Secondary | ICD-10-CM

## 2024-06-08 DIAGNOSIS — Z7689 Persons encountering health services in other specified circumstances: Secondary | ICD-10-CM | POA: Insufficient documentation

## 2024-06-08 NOTE — Progress Notes (Signed)
 New Patient Office Visit  Subjective   Patient ID: [name], female    DOB: 11/24/1980  Age: 43 y.o. MRN: [id number]  CC:  Chief Complaint  Patient presents with   New Patient (Initial Visit)    HPI [name] presents to establish care Previous Primary Care provider/office:   she does not have additional concerns to discuss today.   Patient in office to establish care. Patient doing well overall, no complaints today.  Patient up to date on mammogram.  Discussed recent lab work. Hgb A1c 8.4 03/2024. Patient reports making dietary changes and exercise. Refuses medication. Will recheck prior to next visit.  Patient requesting a referral to have her hearing checked, states family complains she doesn't hear well. Will send referral to audiology.  Continue same medications.    Outpatient Encounter Medications as of 06/08/2024  Medication Sig   atorvastatin (LIPITOR) 10 MG tablet Take 10 mg by mouth daily.   BLACK CURRANT SEED OIL PO Take by mouth.   Elderberry 500 MG CAPS Take by mouth. (Patient taking differently: Take by mouth as needed.)   ferrous sulfate  325 (65 FE) MG tablet TAKE 1 TABLET BY MOUTH TWICE A DAY (Patient taking differently: Take 325 mg by mouth as needed.)   Fluocinolone Acetonide Body 0.01 % OIL Apply topically every other day.   ibuprofen  (ADVIL ) 800 MG tablet Take 1 tablet (800 mg total) by mouth every 8 (eight) hours as needed for mild pain or moderate pain.   magnesium  gluconate (MAGONATE) 500 MG tablet Take 500 mg by mouth daily.   Omega-3 Fatty Acids (FISH OIL PO) Take 2 tablets by mouth daily. 2 grams daily   SYNJARDY XR 12.01-999 MG TB24 Take 1 tablet by mouth every morning.   tretinoin (RETIN-A) 0.025 % cream Apply topically at bedtime.   NON FORMULARY soursop   [DISCONTINUED] metFORMIN (GLUCOPHAGE) 500 MG tablet SMARTSIG:1 Tablet(s) By Mouth Every Evening (Patient not taking: Reported on 06/08/2024)   [DISCONTINUED] norethindrone (MICRONOR) 0.35  MG tablet Take 1 tablet by mouth daily. (Patient not taking: Reported on 06/08/2024)   [DISCONTINUED] terbinafine  (LAMISIL ) 250 MG tablet Take 1 tablet (250 mg total) by mouth daily. (Patient not taking: Reported on 06/08/2024)   No facility-administered encounter medications on file as of 06/08/2024.    Past Medical History:  Diagnosis Date   Anemia    Anxiety    Diabetes mellitus without complication (HCC)    last A1C 5.1   Heart murmur    was seen by cardiology, benign, no follow up needed   Onychomycosis     Past Surgical History:  Procedure Laterality Date   DILATATION & CURETTAGE/HYSTEROSCOPY WITH MYOSURE  08/29/2022   Procedure: HYSTEROSCOPY WITH MYOSURE RESECTION OF UTERINE FIBROIDS;  Surgeon: Rutherford Gain, MD;  Location: [facility];  Service: Gynecology;;   DILATATION & CURRETTAGE/HYSTEROSCOPY WITH RESECTOCOPE N/A 12/07/2022   Procedure: DILATATION & CURETTAGE/HYSTEROSCOPY WITH RESECTOCOPE;  Surgeon: Rutherford Gain, MD;  Location: [facility];  Service: Gynecology;  Laterality: N/A;   DILATION AND CURETTAGE OF UTERUS N/A 08/29/2022   Procedure: ULTRASOUND GUIDED  SUCTION DILATATION AND CURETTAGE,  EXCISION PROLASPED UTERINE FIBROID;  Surgeon: Rutherford Gain, MD;  Location: [facility];  Service: Gynecology;  Laterality: N/A;   NO PAST SURGERIES     OPERATIVE ULTRASOUND N/A 08/29/2022   Procedure: OPERATIVE ULTRASOUND;  Surgeon: Rutherford Gain, MD;  Location: [facility];  Service: Gynecology;  Laterality: N/A;    Family History  Problem Relation Age  of Onset   Hypertension Mother    Diabetes Mother    Diabetes Maternal Grandmother     Social History   Socioeconomic History   Marital status: Significant Other    Spouse name: Not on file   Number of children: 1   Years of education: Not on file   Highest education level: Not on file  Occupational History   Not on file  Tobacco Use   Smoking status: Never   Smokeless tobacco: Never  Vaping Use    Vaping status: Never Used  Substance and Sexual Activity   Alcohol use: Yes    Comment: occ   Drug use: Never   Sexual activity: Not Currently  Other Topics Concern   Not on file  Social History Narrative   Not on file   Social Drivers of Health   Financial Resource Strain: Not on file  Food Insecurity: Not on file  Transportation Needs: Not on file  Physical Activity: Not on file  Stress: Not on file  Social Connections: Not on file  Intimate Partner Violence: Not on file    Review of Systems  Constitutional: Negative.   HENT: Negative.    Eyes: Negative.   Respiratory: Negative.  Negative for shortness of breath.   Cardiovascular: Negative.  Negative for chest pain.  Gastrointestinal: Negative.  Negative for abdominal pain, constipation and diarrhea.  Genitourinary: Negative.   Musculoskeletal:  Negative for joint pain and myalgias.  Skin: Negative.   Neurological: Negative.  Negative for dizziness and headaches.  Endo/Heme/Allergies: Negative.   All other systems reviewed and are negative.       Objective   BP 120/78   Pulse 95   Ht 5' 5 (1.651 m)   Wt 155 lb 9.6 oz (70.6 kg)   LMP 05/08/2024 (Approximate)   SpO2 98%   BMI 25.89 kg/m   Physical Exam Vitals and nursing note reviewed.  Constitutional:      Appearance: Normal appearance. She is normal weight.  HENT:     Head: Normocephalic and atraumatic.     Nose: Nose normal.     Mouth/Throat:     Mouth: Mucous membranes are moist.  Eyes:     Extraocular Movements: Extraocular movements intact.     Conjunctiva/sclera: Conjunctivae normal.     Pupils: Pupils are equal, round, and reactive to light.  Cardiovascular:     Rate and Rhythm: Normal rate and regular rhythm.     Pulses: Normal pulses.     Heart sounds: Normal heart sounds.  Pulmonary:     Effort: Pulmonary effort is normal.     Breath sounds: Normal breath sounds.  Abdominal:     General: Abdomen is flat. Bowel sounds are normal.      Palpations: Abdomen is soft.  Musculoskeletal:        General: Normal range of motion.     Cervical back: Normal range of motion.  Skin:    General: Skin is warm and dry.  Neurological:     General: No focal deficit present.     Mental Status: She is alert and oriented to person, place, and time.  Psychiatric:        Mood and Affect: Mood normal.        Behavior: Behavior normal.        Thought Content: Thought content normal.        Judgment: Judgment normal.        Assessment & Plan:  Referral sent to audiology.  Recheck labs prior to next visit.   Problem List Items Addressed This Visit       Endocrine   Type 2 diabetes mellitus without complication, without long-term current use of insulin  (HCC)   Relevant Medications   atorvastatin (LIPITOR) 10 MG tablet   Other Relevant Orders   CMP14+EGFR   Hemoglobin A1c     Other   Encounter to establish care - Primary   Decreased hearing   Relevant Orders   Ambulatory referral to Audiology   Mixed hyperlipidemia   Relevant Medications   atorvastatin (LIPITOR) 10 MG tablet   Other Relevant Orders   Lipid Profile   Other Visit Diagnoses       Thyroid  disorder screening       Relevant Orders   TSH       Return in about 2 months (around 08/08/2024) for fasting lab work prior.   Total time spent: 25 minutes  [employer], NP  06/08/2024   This document may have been prepared by Dragon Voice Recognition software and as such may include unintentional dictation errors.

## 2024-06-10 ENCOUNTER — Ambulatory Visit: Attending: Cardiology | Admitting: Audiologist

## 2024-06-10 DIAGNOSIS — H9193 Unspecified hearing loss, bilateral: Secondary | ICD-10-CM | POA: Insufficient documentation

## 2024-06-10 NOTE — Procedures (Signed)
  Outpatient Audiology and [facility] [address] [place], [place]  [address] [phone]  AUDIOLOGICAL  EVALUATION  NAME: [name]     DOB:   08/29/1981      MRN: [id number]                                                                                     DATE: 06/10/2024     REFERENT: Fernand Fredy RAMAN, MD STATUS: Outpatient DIAGNOSIS: Normal Hearing    History: Julliette was seen for an audiological evaluation due to difficulty hearing.  Jaxson denies pain, pressure, or tinnitus. She is having to ask people to repeat often. Her family feels he is not hearing well. Money fears she has excessive wax in both ears.  Cledith denied history of hazardous noise exposure.  Medical history shows no additional risk for hearing loss.    Evaluation:  Otoscopy showed a clear view of the tympanic membranes, bilaterally Tympanometry results were consistent with normal middle ear function, bilaterally   Audiometric testing was completed using Conventional Audiometry techniques with insert earphones and supraural headphones. Test results are consistent with normal hearing 250-8kHz bilaterally. Speech Recognition Thresholds were obtained at 15dB HL in the right ear and at 10dB HL in the left ear. Word Recognition Testing was completed at  40dB SL and Timia scored 100% in each ear. Testing done with door open. Leilanni was fearful in booth.    Results:  The test results were reviewed with [facility]. She has normal hearing. All questions answered. Saumya may be having difficulty understanding due to poor processing from fatigue and stress.    Recommendations: No further testing is recommended at this time. If new hearing difficulties are observed further audiological testing is recommended.           21 minutes spent testing and counseling on results.   If you have any questions please feel free to contact me at [phone].  Lauraine Ka Stalnaker Au.D.  Audiologist   06/10/2024  2:24  PM  Cc: Fernand Fredy RAMAN, MD

## 2024-06-15 ENCOUNTER — Ambulatory Visit (INDEPENDENT_AMBULATORY_CARE_PROVIDER_SITE_OTHER): Admitting: Podiatry

## 2024-06-15 ENCOUNTER — Encounter: Payer: Self-pay | Admitting: Podiatry

## 2024-06-15 DIAGNOSIS — M778 Other enthesopathies, not elsewhere classified: Secondary | ICD-10-CM | POA: Diagnosis not present

## 2024-06-15 NOTE — Progress Notes (Signed)
 Presents today for follow-up of plantar verruca left foot.  No pain at the site.  Says it looks like it is gone.  Complain of pain today at the fifth MTP on the right foot.  She gets a callus on this area.   Physical exam:  General appearance: Pleasant, and in no acute distress. AOx3.  Vascular: Pedal pulses: DP 2/4 bilaterally, PT 2/4 bilaterally. Minimal edema lower legs bilaterally. Capillary fill time immediate b/l.SABRA  Neurological: Grossly intact bilaterally  Dermatologic:   Verrucous lesion left foot resolved.  Skin normal temperature bilaterally.  Skin normal color, tone, and texture bilaterally.   Musculoskeletal: Tenderness at fifth MTP plantar laterally on the left right foot.  Tailor's bunion deformity.    Diagnosis: 1.  Capsulitis fifth MTP right   Plan: -Established office visit for evaluation and management.  Level 3. - Verrucous lesion on the left appears to have resolved.  Discussed the recurrence rate of verrucous lesions.  If it does come back again we will probably just biopsy it at that point. - Discussed with her the capsulitis fifth metatarsal phalangeal joint secondary to the tailor's bunion deformity right.  Told her things she can do to reduce the callus that develops there.  Wear good supportive shoes like the new balance she has on today.  She can also use Voltaren gel on it.  Return as needed

## 2024-07-16 ENCOUNTER — Ambulatory Visit: Payer: Self-pay | Admitting: Internal Medicine

## 2024-07-31 ENCOUNTER — Encounter: Payer: Self-pay | Admitting: Cardiology

## 2024-07-31 ENCOUNTER — Ambulatory Visit (INDEPENDENT_AMBULATORY_CARE_PROVIDER_SITE_OTHER): Admitting: Cardiology

## 2024-07-31 VITALS — BP 122/72 | HR 99 | Ht 65.0 in | Wt 156.0 lb

## 2024-07-31 DIAGNOSIS — E119 Type 2 diabetes mellitus without complications: Secondary | ICD-10-CM

## 2024-07-31 DIAGNOSIS — E1165 Type 2 diabetes mellitus with hyperglycemia: Secondary | ICD-10-CM | POA: Diagnosis not present

## 2024-07-31 DIAGNOSIS — Z1329 Encounter for screening for other suspected endocrine disorder: Secondary | ICD-10-CM

## 2024-07-31 DIAGNOSIS — E782 Mixed hyperlipidemia: Secondary | ICD-10-CM | POA: Diagnosis not present

## 2024-07-31 DIAGNOSIS — H9190 Unspecified hearing loss, unspecified ear: Secondary | ICD-10-CM

## 2024-07-31 DIAGNOSIS — Z0130 Encounter for examination of blood pressure without abnormal findings: Secondary | ICD-10-CM

## 2024-07-31 MED ORDER — ATORVASTATIN CALCIUM 10 MG PO TABS: 10.0000 mg | ORAL_TABLET | Freq: Every day | ORAL | 1 refills | Status: DC

## 2024-07-31 MED ORDER — TRIAMCINOLONE ACETONIDE 0.5 % EX OINT: 1.0000 | TOPICAL_OINTMENT | Freq: Two times a day (BID) | CUTANEOUS | 0 refills | Status: DC

## 2024-07-31 NOTE — Progress Notes (Signed)
 Established Patient Office Visit  Subjective:  Patient ID: [name], female    DOB: [birth year]  Age: 43 y.o. MRN: [id number]  Chief Complaint  Patient presents with   Follow-up    Patient in office for 2 month follow up, discuss recent lab results. Patient complaining of a rash on her chest and neck, itching. Rash started Wednesday Will send in hydrocortisone cream.  Did not have lab work done, will go to [employer] when fasting. Will call with results.  Pap smear 08/2023 negative. Referral sent to ENT at previous visit for decreased hearing.  Continue current medications.     No other concerns at this time.   Past Medical History:  Diagnosis Date   Anemia    Anxiety    Diabetes mellitus without complication (HCC)    last A1C 5.1   Heart murmur    was seen by cardiology, benign, no follow up needed   Onychomycosis    SIRS (systemic inflammatory response syndrome) (HCC) 07/09/2021    Past Surgical History:  Procedure Laterality Date   DILATATION & CURETTAGE/HYSTEROSCOPY WITH MYOSURE  08/29/2022   Procedure: HYSTEROSCOPY WITH MYOSURE RESECTION OF UTERINE FIBROIDS;  Surgeon: Rutherford Gain, MD;  Location: [facility];  Service: Gynecology;;   DILATATION & CURRETTAGE/HYSTEROSCOPY WITH RESECTOCOPE N/A 12/07/2022   Procedure: DILATATION & CURETTAGE/HYSTEROSCOPY WITH RESECTOCOPE;  Surgeon: Rutherford Gain, MD;  Location: [facility];  Service: Gynecology;  Laterality: N/A;   DILATION AND CURETTAGE OF UTERUS N/A 08/29/2022   Procedure: ULTRASOUND GUIDED  SUCTION DILATATION AND CURETTAGE,  EXCISION PROLASPED UTERINE FIBROID;  Surgeon: Rutherford Gain, MD;  Location: [facility];  Service: Gynecology;  Laterality: N/A;   NO PAST SURGERIES     OPERATIVE ULTRASOUND N/A 08/29/2022   Procedure: OPERATIVE ULTRASOUND;  Surgeon: Rutherford Gain, MD;  Location: [facility];  Service: Gynecology;  Laterality: N/A;    Social History   Socioeconomic History   Marital status:  Significant Other    Spouse name: Not on file   Number of children: 1   Years of education: Not on file   Highest education level: Not on file  Occupational History   Not on file  Tobacco Use   Smoking status: Never   Smokeless tobacco: Never  Vaping Use   Vaping status: Never Used  Substance and Sexual Activity   Alcohol use: Yes    Comment: occ   Drug use: Never   Sexual activity: Not Currently  Other Topics Concern   Not on file  Social History Narrative   Not on file   Social Drivers of Health   Financial Resource Strain: Not on file  Food Insecurity: Not on file  Transportation Needs: Not on file  Physical Activity: Not on file  Stress: Not on file  Social Connections: Not on file  Intimate Partner Violence: Not on file    Family History  Problem Relation Age of Onset   Hypertension Mother    Diabetes Mother    Diabetes Maternal Grandmother     No Known Allergies  Outpatient Medications Prior to Visit  Medication Sig   spironolactone (ALDACTONE) 50 MG tablet Take 50 mg by mouth daily.   BLACK CURRANT SEED OIL PO Take by mouth.   Elderberry 500 MG CAPS Take by mouth. (Patient taking differently: Take by mouth as needed.)   ferrous sulfate  325 (65 FE) MG tablet TAKE 1 TABLET BY MOUTH TWICE A DAY (Patient taking differently: Take 325 mg by mouth as needed.)  Fluocinolone Acetonide Body 0.01 % OIL Apply topically every other day.   ibuprofen  (ADVIL ) 800 MG tablet Take 1 tablet (800 mg total) by mouth every 8 (eight) hours as needed for mild pain or moderate pain.   magnesium  gluconate (MAGONATE) 500 MG tablet Take 500 mg by mouth daily.   NON FORMULARY soursop   Omega-3 Fatty Acids (FISH OIL PO) Take 2 tablets by mouth daily. 2 grams daily   SYNJARDY XR 12.01-999 MG TB24 Take 1 tablet by mouth every morning.   tretinoin (RETIN-A) 0.025 % cream Apply topically at bedtime.   [DISCONTINUED] atorvastatin (LIPITOR) 10 MG tablet Take 10 mg by mouth daily.   No  facility-administered medications prior to visit.    Review of Systems  Constitutional: Negative.   HENT: Negative.    Eyes: Negative.   Respiratory: Negative.  Negative for shortness of breath.   Cardiovascular: Negative.  Negative for chest pain.  Gastrointestinal: Negative.  Negative for abdominal pain, constipation and diarrhea.  Genitourinary: Negative.   Musculoskeletal:  Negative for joint pain and myalgias.  Skin:  Positive for itching and rash.  Neurological: Negative.  Negative for dizziness and headaches.  Endo/Heme/Allergies: Negative.   All other systems reviewed and are negative.      Objective:   BP 122/72   Pulse 99   Ht 5' 5 (1.651 m)   Wt 156 lb (70.8 kg)   SpO2 99%   BMI 25.96 kg/m   Vitals:   07/31/24 0921  BP: 122/72  Pulse: 99  Height: 5' 5 (1.651 m)  Weight: 156 lb (70.8 kg)  SpO2: 99%  BMI (Calculated): 25.96    Physical Exam Vitals and nursing note reviewed.  Constitutional:      Appearance: Normal appearance. She is normal weight.  HENT:     Head: Normocephalic and atraumatic.     Nose: Nose normal.     Mouth/Throat:     Mouth: Mucous membranes are moist.  Eyes:     Extraocular Movements: Extraocular movements intact.     Conjunctiva/sclera: Conjunctivae normal.     Pupils: Pupils are equal, round, and reactive to light.  Cardiovascular:     Rate and Rhythm: Normal rate and regular rhythm.     Pulses: Normal pulses.     Heart sounds: Normal heart sounds.  Pulmonary:     Effort: Pulmonary effort is normal.     Breath sounds: Normal breath sounds.  Abdominal:     General: Abdomen is flat. Bowel sounds are normal.     Palpations: Abdomen is soft.  Musculoskeletal:        General: Normal range of motion.     Cervical back: Normal range of motion.  Skin:    General: Skin is warm and dry.  Neurological:     General: No focal deficit present.     Mental Status: She is alert and oriented to person, place, and time.   Psychiatric:        Mood and Affect: Mood normal.        Behavior: Behavior normal.        Thought Content: Thought content normal.        Judgment: Judgment normal.      No results found for any visits on 07/31/24.  Recent Results (from the past 2160 hours)  Hepatic function panel     Status: None   Collection Time: 05/20/24  8:06 AM  Result Value Ref Range   Total Protein 7.8 6.0 - 8.5  g/dL   Albumin 4.7 3.9 - 4.9 g/dL   Bilirubin Total 0.4 0.0 - 1.2 mg/dL   Bilirubin, Direct 9.85 0.00 - 0.40 mg/dL   Alkaline Phosphatase 61 44 - 121 IU/L   AST 18 0 - 40 IU/L   ALT 28 0 - 32 IU/L      Assessment & Plan:  Hydrocortisone cream Return for fasting lab work Continue current medications  Problem List Items Addressed This Visit       Endocrine   Type 2 diabetes mellitus without complication, without long-term current use of insulin  (HCC) - Primary   Relevant Medications   atorvastatin (LIPITOR) 10 MG tablet   Other Relevant Orders   CMP14+EGFR   Hemoglobin A1c     Other   Decreased hearing   Mixed hyperlipidemia   Relevant Medications   spironolactone (ALDACTONE) 50 MG tablet   atorvastatin (LIPITOR) 10 MG tablet   Other Relevant Orders   CMP14+EGFR   Lipid Profile   Other Visit Diagnoses       Thyroid  disorder screening       Relevant Orders   CMP14+EGFR   TSH       Return in about 4 months (around 11/28/2024) for fasting lab work prior.   Total time spent: 25 minutes. This time includes review of previous notes and results and patient face to face interaction during today's visit.    Jeoffrey Pollen, NP  07/31/2024   This document may have been prepared by Dragon Voice Recognition software and as such may include unintentional dictation errors.

## 2024-08-04 ENCOUNTER — Ambulatory Visit: Payer: Self-pay | Admitting: Cardiology

## 2024-08-04 ENCOUNTER — Other Ambulatory Visit: Payer: Self-pay | Admitting: Cardiology

## 2024-08-04 LAB — HEMOGLOBIN A1C
Est. average glucose Bld gHb Est-mCnc: 160 mg/dL
Hgb A1c MFr Bld: 7.2 % — ABNORMAL HIGH (ref 4.8–5.6)

## 2024-08-04 LAB — LIPID PANEL
Chol/HDL Ratio: 3.2 ratio (ref 0.0–4.4)
Cholesterol, Total: 188 mg/dL (ref 100–199)
HDL: 59 mg/dL (ref >39)
LDL Chol Calc (NIH): 111 mg/dL — ABNORMAL HIGH (ref 0–99)
Triglycerides: 103 mg/dL (ref 0–149)
VLDL Cholesterol Cal: 18 mg/dL (ref 5–40)

## 2024-08-04 LAB — CMP14+EGFR
ALT: 20 IU/L (ref 0–32)
AST: 12 IU/L (ref 0–40)
Albumin: 4.7 g/dL (ref 3.9–4.9)
Alkaline Phosphatase: 62 IU/L (ref 41–116)
BUN/Creatinine Ratio: 23 (ref 9–23)
BUN: 22 mg/dL (ref 6–24)
Bilirubin Total: 0.4 mg/dL (ref 0.0–1.2)
CO2: 22 mmol/L (ref 20–29)
Calcium: 10.2 mg/dL (ref 8.7–10.2)
Chloride: 101 mmol/L (ref 96–106)
Creatinine, Ser: 0.94 mg/dL (ref 0.57–1.00)
Globulin, Total: 3 g/dL (ref 1.5–4.5)
Glucose: 144 mg/dL — ABNORMAL HIGH (ref 70–99)
Potassium: 4.4 mmol/L (ref 3.5–5.2)
Sodium: 138 mmol/L (ref 134–144)
Total Protein: 7.7 g/dL (ref 6.0–8.5)
eGFR: 77 mL/min/1.73 (ref >59)

## 2024-08-04 LAB — TSH: TSH: 1.25 u[IU]/mL (ref 0.450–4.500)

## 2024-08-04 MED ORDER — SYNJARDY XR 12.5-1000 MG PO TB24: 1.0000 | ORAL_TABLET | Freq: Every morning | ORAL | 1 refills | Status: DC

## 2024-08-06 ENCOUNTER — Other Ambulatory Visit: Payer: Self-pay | Admitting: Cardiology

## 2024-08-06 ENCOUNTER — Emergency Department
Admission: EM | Admit: 2024-08-06 | Discharge: 2024-08-07 | Discharge status: Against Medical Advice | Attending: Emergency Medicine | Admitting: Emergency Medicine

## 2024-08-06 ENCOUNTER — Encounter: Payer: Self-pay | Admitting: *Deleted

## 2024-08-06 ENCOUNTER — Other Ambulatory Visit: Payer: Self-pay

## 2024-08-06 DIAGNOSIS — Z5321 Procedure and treatment not carried out due to patient leaving prior to being seen by health care provider: Secondary | ICD-10-CM | POA: Diagnosis not present

## 2024-08-06 DIAGNOSIS — D72829 Elevated white blood cell count, unspecified: Secondary | ICD-10-CM | POA: Diagnosis not present

## 2024-08-06 DIAGNOSIS — E1165 Type 2 diabetes mellitus with hyperglycemia: Secondary | ICD-10-CM | POA: Diagnosis not present

## 2024-08-06 DIAGNOSIS — F12988 Cannabis use, unspecified with other cannabis-induced disorder: Secondary | ICD-10-CM | POA: Diagnosis not present

## 2024-08-06 DIAGNOSIS — R112 Nausea with vomiting, unspecified: Secondary | ICD-10-CM | POA: Diagnosis present

## 2024-08-06 DIAGNOSIS — R1116 Cannabis hyperemesis syndrome: Secondary | ICD-10-CM | POA: Diagnosis not present

## 2024-08-06 DIAGNOSIS — R000 Tachycardia, unspecified: Secondary | ICD-10-CM | POA: Diagnosis not present

## 2024-08-06 DIAGNOSIS — R1013 Epigastric pain: Secondary | ICD-10-CM | POA: Diagnosis not present

## 2024-08-06 LAB — COMPREHENSIVE METABOLIC PANEL WITH GFR
ALT: 16 U/L (ref 0–44)
AST: 15 U/L (ref 15–41)
Albumin: 4.5 g/dL (ref 3.5–5.0)
Alkaline Phosphatase: 63 U/L (ref 38–126)
Anion gap: 20 — ABNORMAL HIGH (ref 5–15)
BUN: 13 mg/dL (ref 6–20)
CO2: 19 mmol/L — ABNORMAL LOW (ref 22–32)
Calcium: 9.5 mg/dL (ref 8.9–10.3)
Chloride: 100 mmol/L (ref 98–111)
Creatinine, Ser: 0.82 mg/dL (ref 0.44–1.00)
GFR, Estimated: >60 mL/min (ref >60)
Glucose, Bld: 189 mg/dL — ABNORMAL HIGH (ref 70–99)
Potassium: 3.9 mmol/L (ref 3.5–5.1)
Sodium: 139 mmol/L (ref 135–145)
Total Bilirubin: 0.5 mg/dL (ref 0.0–1.2)
Total Protein: 8.3 g/dL — ABNORMAL HIGH (ref 6.5–8.1)

## 2024-08-06 LAB — CBC
HCT: 39.4 % (ref 36.0–46.0)
Hemoglobin: 13.1 g/dL (ref 12.0–15.0)
MCH: 30.6 pg (ref 26.0–34.0)
MCHC: 33.2 g/dL (ref 30.0–36.0)
MCV: 92.1 fL (ref 80.0–100.0)
Platelets: 321 K/uL (ref 150–400)
RBC: 4.28 MIL/uL (ref 3.87–5.11)
RDW: 13.2 % (ref 11.5–15.5)
WBC: 17 K/uL — ABNORMAL HIGH (ref 4.0–10.5)
nRBC: 0 % (ref 0.0–0.2)

## 2024-08-06 LAB — LIPASE, BLOOD: Lipase: 15 U/L (ref 11–51)

## 2024-08-06 MED ORDER — ATORVASTATIN CALCIUM 20 MG PO TABS: 20.0000 mg | ORAL_TABLET | Freq: Every day | ORAL | 1 refills | Status: AC

## 2024-08-06 NOTE — ED Triage Notes (Signed)
 Pt to triage via wheelchair.  Pt has abd pain with n/v.  Sx began today.  No vag bleeding.  No back pain.  Denies urinary sx  pt alert

## 2024-08-06 NOTE — Telephone Encounter (Signed)
 Pt states she does take her diabetes medication and her statin. Pt is wondering how to get her LDL down as she eats healthily, avoids red meat, and exercises.

## 2024-08-06 NOTE — Progress Notes (Signed)
 Left vm to return call.

## 2024-08-07 ENCOUNTER — Other Ambulatory Visit: Payer: Self-pay

## 2024-08-07 ENCOUNTER — Emergency Department
Admission: EM | Admit: 2024-08-07 | Discharge: 2024-08-07 | Disposition: A | Discharge status: Home / Self Care | Source: Home / Self Care | Attending: Emergency Medicine | Admitting: Emergency Medicine

## 2024-08-07 ENCOUNTER — Telehealth: Payer: Self-pay

## 2024-08-07 DIAGNOSIS — R1116 Cannabis hyperemesis syndrome: Secondary | ICD-10-CM | POA: Insufficient documentation

## 2024-08-07 DIAGNOSIS — R1013 Epigastric pain: Secondary | ICD-10-CM | POA: Insufficient documentation

## 2024-08-07 DIAGNOSIS — E1165 Type 2 diabetes mellitus with hyperglycemia: Secondary | ICD-10-CM | POA: Insufficient documentation

## 2024-08-07 DIAGNOSIS — F12988 Cannabis use, unspecified with other cannabis-induced disorder: Secondary | ICD-10-CM | POA: Insufficient documentation

## 2024-08-07 DIAGNOSIS — R000 Tachycardia, unspecified: Secondary | ICD-10-CM | POA: Insufficient documentation

## 2024-08-07 DIAGNOSIS — R112 Nausea with vomiting, unspecified: Secondary | ICD-10-CM | POA: Insufficient documentation

## 2024-08-07 DIAGNOSIS — D72829 Elevated white blood cell count, unspecified: Secondary | ICD-10-CM | POA: Insufficient documentation

## 2024-08-07 LAB — COMPREHENSIVE METABOLIC PANEL WITH GFR
ALT: 16 U/L (ref 0–44)
AST: 16 U/L (ref 15–41)
Albumin: 4.6 g/dL (ref 3.5–5.0)
Alkaline Phosphatase: 62 U/L (ref 38–126)
Anion gap: 22 — ABNORMAL HIGH (ref 5–15)
BUN: 16 mg/dL (ref 6–20)
CO2: 19 mmol/L — ABNORMAL LOW (ref 22–32)
Calcium: 9.8 mg/dL (ref 8.9–10.3)
Chloride: 97 mmol/L — ABNORMAL LOW (ref 98–111)
Creatinine, Ser: 0.85 mg/dL (ref 0.44–1.00)
GFR, Estimated: >60 mL/min (ref >60)
Glucose, Bld: 202 mg/dL — ABNORMAL HIGH (ref 70–99)
Potassium: 4 mmol/L (ref 3.5–5.1)
Sodium: 138 mmol/L (ref 135–145)
Total Bilirubin: 0.4 mg/dL (ref 0.0–1.2)
Total Protein: 8.5 g/dL — ABNORMAL HIGH (ref 6.5–8.1)

## 2024-08-07 LAB — BASIC METABOLIC PANEL WITH GFR
Anion gap: 18 — ABNORMAL HIGH (ref 5–15)
BUN: 15 mg/dL (ref 6–20)
CO2: 20 mmol/L — ABNORMAL LOW (ref 22–32)
Calcium: 9.1 mg/dL (ref 8.9–10.3)
Chloride: 99 mmol/L (ref 98–111)
Creatinine, Ser: 0.79 mg/dL (ref 0.44–1.00)
GFR, Estimated: >60 mL/min (ref >60)
Glucose, Bld: 186 mg/dL — ABNORMAL HIGH (ref 70–99)
Potassium: 3.9 mmol/L (ref 3.5–5.1)
Sodium: 137 mmol/L (ref 135–145)

## 2024-08-07 LAB — CBG MONITORING, ED: Glucose-Capillary: 183 mg/dL — ABNORMAL HIGH (ref 70–99)

## 2024-08-07 LAB — CBC
HCT: 38.9 % (ref 36.0–46.0)
Hemoglobin: 13.2 g/dL (ref 12.0–15.0)
MCH: 31 pg (ref 26.0–34.0)
MCHC: 33.9 g/dL (ref 30.0–36.0)
MCV: 91.3 fL (ref 80.0–100.0)
Platelets: 336 K/uL (ref 150–400)
RBC: 4.26 MIL/uL (ref 3.87–5.11)
RDW: 13.2 % (ref 11.5–15.5)
WBC: 15.1 K/uL — ABNORMAL HIGH (ref 4.0–10.5)
nRBC: 0 % (ref 0.0–0.2)

## 2024-08-07 LAB — HCG, QUANTITATIVE, PREGNANCY: hCG, Beta Chain, Quant, S: <1 m[IU]/mL (ref <5)

## 2024-08-07 LAB — LIPASE, BLOOD: Lipase: 14 U/L (ref 11–51)

## 2024-08-07 MED ORDER — DOXYLAMINE SUCCINATE (SLEEP) 25 MG PO TABS
25.0000 mg | ORAL_TABLET | Freq: Once | ORAL | Status: DC
  Filled 2024-08-07: qty 1

## 2024-08-07 MED ORDER — PROMETHAZINE HCL 12.5 MG PO TABS: 12.5000 mg | ORAL_TABLET | Freq: Four times a day (QID) | ORAL | 0 refills | Status: DC | PRN

## 2024-08-07 MED ORDER — ONDANSETRON HCL 4 MG/2ML IJ SOLN
4.0000 mg | Freq: Once | INTRAMUSCULAR | Status: AC
  Administered 2024-08-07: 4 mg via INTRAVENOUS
  Filled 2024-08-07: qty 2

## 2024-08-07 MED ORDER — ALUM & MAG HYDROXIDE-SIMETH 200-200-20 MG/5ML PO SUSP
30.0000 mL | Freq: Once | ORAL | Status: AC
  Administered 2024-08-07: 30 mL via ORAL
  Filled 2024-08-07: qty 30

## 2024-08-07 MED ORDER — DROPERIDOL 2.5 MG/ML IJ SOLN
2.5000 mg | Freq: Once | INTRAMUSCULAR | Status: AC
  Administered 2024-08-07: 2.5 mg via INTRAVENOUS
  Filled 2024-08-07: qty 2

## 2024-08-07 MED ORDER — LIDOCAINE VISCOUS HCL 2 % MT SOLN
15.0000 mL | Freq: Once | OROMUCOSAL | Status: AC
  Administered 2024-08-07: 15 mL via OROMUCOSAL
  Filled 2024-08-07: qty 15

## 2024-08-07 MED ORDER — ONDANSETRON HCL 4 MG PO TABS: 4.0000 mg | ORAL_TABLET | Freq: Four times a day (QID) | ORAL | 0 refills | Status: AC | PRN

## 2024-08-07 MED ORDER — SODIUM CHLORIDE 0.9 % IV BOLUS
1000.0000 mL | Freq: Once | INTRAVENOUS | Status: AC
  Administered 2024-08-07: 1000 mL via INTRAVENOUS

## 2024-08-07 NOTE — ED Provider Notes (Signed)
 [facility] Provider Note    Event Date/Time   First MD Initiated Contact with Patient 08/07/24 [phone]     (approximate)   History   Emesis   HPI  [name] is a 43 year old female with history of T2DM presenting to the emergency department for evaluation of vomiting abdominal pain.  Symptoms began Thursday morning.  Reports multiple episodes of nonbloody vomiting.  Regular bowel movements, not diarrhea.  Reports upper abdominal pain.  Says she had something similar a few years ago when she was found out she had diabetes.  Does report marijuana use.  Reviewed discharge summary from 07/12/2021.  At that time patient presented with new diagnosis of type 2 diabetes with nausea and vomiting.  Questionable sepsis at that time possibly related to UTI.     Physical Exam   Triage Vital Signs: ED Triage Vitals  Encounter Vitals Group     BP 08/07/24 0358 (!) 176/77     Girls Systolic BP Percentile --      Girls Diastolic BP Percentile --      Boys Systolic BP Percentile --      Boys Diastolic BP Percentile --      Pulse Rate 08/07/24 0358 (!) 101     Resp 08/07/24 0358 18     Temp 08/07/24 0401 98.2 F (36.8 C)     Temp src --      SpO2 08/07/24 0358 99 %     Weight --      Height --      Head Circumference --      Peak Flow --      Pain Score 08/07/24 0359 7     Pain Loc --      Pain Education --      Exclude from Growth Chart --     Most recent vital signs: Vitals:   08/07/24 0630 08/07/24 0700  BP: 131/64 (!) 106/52  Pulse: 91 93  Resp:    Temp:    SpO2:  97%     General: Awake, interactive  CV:  Good peripheral perfusion Resp:  Unlabored respirations Abd:  Nondistended, soft, mild tenderness in the epigastric region, remainder of abdomen nontender Neuro:  Symmetric facial movement, fluid speech   ED Results / Procedures / Treatments   Labs (all labs ordered are listed, but only abnormal results are displayed) Labs Reviewed   COMPREHENSIVE METABOLIC PANEL WITH GFR - Abnormal; Notable for the following components:      Result Value   Chloride 97 (*)    CO2 19 (*)    Glucose, Bld 202 (*)    Total Protein 8.5 (*)    Anion gap 22 (*)    All other components within normal limits  CBC - Abnormal; Notable for the following components:   WBC 15.1 (*)    All other components within normal limits  CBG MONITORING, ED - Abnormal; Notable for the following components:   Glucose-Capillary 183 (*)    All other components within normal limits  LIPASE, BLOOD  URINALYSIS, ROUTINE W REFLEX MICROSCOPIC  HCG, QUANTITATIVE, PREGNANCY  BASIC METABOLIC PANEL WITH GFR  POC URINE PREG, ED     EKG EKG independently reviewed and interpreted by myself demonstrates:    RADIOLOGY Imaging independently reviewed and interpreted by myself demonstrates:   Formal Radiology Read:  No results found.  PROCEDURES:  Critical Care performed: No  Procedures   MEDICATIONS ORDERED IN ED: Medications  sodium chloride   0.9 % bolus 1,000 mL (0 mLs Intravenous Stopped 08/07/24 0700)  ondansetron  (ZOFRAN ) injection 4 mg (4 mg Intravenous Given 08/07/24 0428)  alum & mag hydroxide-simeth (MAALOX/MYLANTA) 200-200-20 MG/5ML suspension 30 mL (30 mLs Oral Given 08/07/24 0429)  lidocaine  (XYLOCAINE ) 2 % viscous mouth solution 15 mL (15 mLs Mouth/Throat Given 08/07/24 0429)  droperidol (INAPSINE) 2.5 MG/ML injection 2.5 mg (2.5 mg Intravenous Given 08/07/24 0504)     IMPRESSION / MDM / ASSESSMENT AND PLAN / ED COURSE  I reviewed the triage vital signs and the nursing notes.  Differential diagnosis includes, but is not limited to, gastritis, GERD, cannabinoid hyperemesis, pancreatitis, lower suspicion other acute intra-abdominal process given overall reassuring abdominal exam, consideration for cannabinoid hyperemesis  Patient's presentation is most consistent with acute presentation with potential threat to life or bodily  function.  43 year old female presenting to the emergency department for evaluation of vomiting and abdominal pain.  Mild tachycardia on presentation.  BGL 183, low suspicion DKA.  Will obtain labs to further evaluate.  Ordered for symptomatic treatment with fluids, Zofran , GI cocktail.  Labs with leukocytosis to BC 15.1, normal hemoglobin.  CMP with only slightly elevated glucose at 200, anion gap acidosis noted, suspect this is more likely related to dehydration than euglycemic DKA.  Normal lipase.  Patient did have vomiting after receiving Zofran .  Will trial droperidol.  Clinical Course as of 08/07/24 0708  Fri Aug 07, 2024  9383 Patient reassessed after droperidol. Feels improved, wants to trial PO. Will repeat BMP to ensure AGMA is improving.  [NR]  0708 Signed out to oncoming physician pending repeat BMP, hcg, and disposition.  [NR]    Clinical Course User Index [NR] Levander Slate, MD     FINAL CLINICAL IMPRESSION(S) / ED DIAGNOSES   Final diagnoses:  Nausea and vomiting, unspecified vomiting type  Epigastric pain     Rx / DC Orders   ED Discharge Orders          Ordered    ondansetron  (ZOFRAN ) 4 MG tablet  Every 6 hours PRN        08/07/24 0643    promethazine  (PHENERGAN ) 12.5 MG tablet  Every 6 hours PRN        08/07/24 9356             Note:  This document was prepared using Dragon voice recognition software and may include unintentional dictation errors.   Levander Slate, MD 08/07/24 [phone]

## 2024-08-07 NOTE — ED Triage Notes (Signed)
 POV from home for N/V since yesterday with abdominal pain. Denies any urinary symptoms. Hx of type 2 diabetes.

## 2024-08-07 NOTE — ED Notes (Signed)
CBG 183 

## 2024-08-07 NOTE — Telephone Encounter (Signed)
 Patient called asking if you can send her in the same liquid drink they gave her in the hospital to help ease her stomach

## 2024-08-07 NOTE — ED Notes (Signed)
 Patient unable to be located for room placement at 2312 11/13 and now. Assumed to have left without notifying staff.

## 2024-08-07 NOTE — Discharge Instructions (Addendum)
 Your testing today was fortunately reassuring. I have sent a prescription for two nausea medications to your pharmacy that you can take as needed.  The Phenergan  can make you drowsy, do not drive or operate machinery when taking this. Follow-up with your PCP for further evaluation. Return to the ER for new or worsening symptoms.

## 2024-08-07 NOTE — ED Provider Notes (Signed)
.-----------------------------------------   7:11 AM on 08/07/2024 -----------------------------------------  Blood pressure (!) 106/52, pulse 93, temperature 98.2 F (36.8 C), resp. rate 18, last menstrual period 07/14/2024, SpO2 97%, unknown if currently breastfeeding.  Assuming care from Dr. Levander.  In short, [name] is a 43 y.o. female with a chief complaint of Emesis .  Refer to the original H&P for additional details.  The current plan of care is to follow-up BMP, pregnancy test, reassessment.  On reassessment patient was sleeping, woke up easily to voice, discussed her about lab results, she is tolerating p.o., feels a lot better, heart rate in the room was 80s to low 90s.  No urinary symptoms.  No abdominal pain this time.  Discussed with patient and family about diet progression as tolerated, to start with soups, porridge, oatmeal, and to avoid spicy, acidic, creamy foods.  Discussed with her about following up with primary care doctor next week to get reassessed.  Strict return precautions given.  Shared decision making done with patient and family and they are agreeable with this plan.  Discharge.  Clinical Course as of 08/07/24 0725  Fri Aug 07, 2024  9383 Patient reassessed after droperidol. Feels improved, wants to trial PO. Will repeat BMP to ensure AGMA is improving.  [NR]  0708 Signed out to oncoming physician pending repeat BMP, hcg, and disposition.  [NR]  0717 HCG, Beta Chain, Quant, S: <1 Negative [TT]  0717 Basic metabolic panel(!) Bicarb and anion gap improving with fluids. [TT]    Clinical Course User Index [NR] Levander Slate, MD [TT] Waymond Lorelle Cummins, MD      Waymond Lorelle Cummins, MD 08/07/24 [phone]

## 2024-08-07 NOTE — ED Triage Notes (Signed)
 Pt presents for emesis. Hx DM. Seen yesterday for same and discharged. States she has not eaten in 2 days or slept. Endorsing abdominal pain (left side), decreased urine output.   Past Medical History:  Diagnosis Date   Anemia    Anxiety    Diabetes mellitus without complication (HCC)    last A1C 5.1   Heart murmur    was seen by cardiology, benign, no follow up needed   Onychomycosis    SIRS (systemic inflammatory response syndrome) (HCC) 07/09/2021

## 2024-08-08 ENCOUNTER — Emergency Department
Admission: EM | Admit: 2024-08-08 | Discharge: 2024-08-08 | Disposition: A | Discharge status: Home / Self Care | Source: Home / Self Care | Attending: Emergency Medicine | Admitting: Emergency Medicine

## 2024-08-08 DIAGNOSIS — R112 Nausea with vomiting, unspecified: Secondary | ICD-10-CM | POA: Diagnosis not present

## 2024-08-08 DIAGNOSIS — R1116 Cannabis hyperemesis syndrome: Secondary | ICD-10-CM

## 2024-08-08 LAB — CBC WITH DIFFERENTIAL/PLATELET
Abs Immature Granulocytes: 0.11 K/uL — ABNORMAL HIGH (ref 0.00–0.07)
Basophils Absolute: 0.1 K/uL (ref 0.0–0.1)
Basophils Relative: 0 %
Eosinophils Absolute: 0 K/uL (ref 0.0–0.5)
Eosinophils Relative: 0 %
HCT: 36.4 % (ref 36.0–46.0)
Hemoglobin: 12.3 g/dL (ref 12.0–15.0)
Immature Granulocytes: 1 %
Lymphocytes Relative: 8 %
Lymphs Abs: 1.6 K/uL (ref 0.7–4.0)
MCH: 30.5 pg (ref 26.0–34.0)
MCHC: 33.8 g/dL (ref 30.0–36.0)
MCV: 90.3 fL (ref 80.0–100.0)
Monocytes Absolute: 1.7 K/uL — ABNORMAL HIGH (ref 0.1–1.0)
Monocytes Relative: 9 %
Neutro Abs: 15.5 K/uL — ABNORMAL HIGH (ref 1.7–7.7)
Neutrophils Relative %: 82 %
Platelets: 339 K/uL (ref 150–400)
RBC: 4.03 MIL/uL (ref 3.87–5.11)
RDW: 13 % (ref 11.5–15.5)
WBC: 18.9 K/uL — ABNORMAL HIGH (ref 4.0–10.5)
nRBC: 0 % (ref 0.0–0.2)

## 2024-08-08 LAB — MAGNESIUM: Magnesium: 2.5 mg/dL — ABNORMAL HIGH (ref 1.7–2.4)

## 2024-08-08 LAB — COMPREHENSIVE METABOLIC PANEL WITH GFR
ALT: 13 U/L (ref 0–44)
AST: 13 U/L — ABNORMAL LOW (ref 15–41)
Albumin: 4.1 g/dL (ref 3.5–5.0)
Alkaline Phosphatase: 55 U/L (ref 38–126)
Anion gap: 15 (ref 5–15)
BUN: 22 mg/dL — ABNORMAL HIGH (ref 6–20)
CO2: 25 mmol/L (ref 22–32)
Calcium: 9.2 mg/dL (ref 8.9–10.3)
Chloride: 97 mmol/L — ABNORMAL LOW (ref 98–111)
Creatinine, Ser: 0.84 mg/dL (ref 0.44–1.00)
GFR, Estimated: >60 mL/min (ref >60)
Glucose, Bld: 177 mg/dL — ABNORMAL HIGH (ref 70–99)
Potassium: 3.6 mmol/L (ref 3.5–5.1)
Sodium: 137 mmol/L (ref 135–145)
Total Bilirubin: 0.4 mg/dL (ref 0.0–1.2)
Total Protein: 7.6 g/dL (ref 6.5–8.1)

## 2024-08-08 LAB — LIPASE, BLOOD: Lipase: 17 U/L (ref 11–51)

## 2024-08-08 MED ORDER — LIDOCAINE VISCOUS HCL 2 % MT SOLN
15.0000 mL | Freq: Once | OROMUCOSAL | Status: AC
  Administered 2024-08-08: 15 mL via ORAL
  Filled 2024-08-08: qty 15

## 2024-08-08 MED ORDER — LACTATED RINGERS IV BOLUS
1000.0000 mL | Freq: Once | INTRAVENOUS | Status: AC
  Administered 2024-08-08: 1000 mL via INTRAVENOUS

## 2024-08-08 MED ORDER — PANTOPRAZOLE SODIUM 40 MG IV SOLR
40.0000 mg | Freq: Once | INTRAVENOUS | Status: AC
  Administered 2024-08-08: 40 mg via INTRAVENOUS
  Filled 2024-08-08: qty 10

## 2024-08-08 MED ORDER — ALUM & MAG HYDROXIDE-SIMETH 200-200-20 MG/5ML PO SUSP
30.0000 mL | Freq: Once | ORAL | Status: AC
  Administered 2024-08-08: 30 mL via ORAL
  Filled 2024-08-08: qty 30

## 2024-08-08 MED ORDER — DROPERIDOL 2.5 MG/ML IJ SOLN
2.5000 mg | Freq: Once | INTRAMUSCULAR | Status: AC
  Administered 2024-08-08: 2.5 mg via INTRAVENOUS
  Filled 2024-08-08: qty 2

## 2024-08-08 MED ORDER — FAMOTIDINE IN NACL 20-0.9 MG/50ML-% IV SOLN
20.0000 mg | Freq: Once | INTRAVENOUS | Status: AC
  Administered 2024-08-08: 20 mg via INTRAVENOUS
  Filled 2024-08-08: qty 50

## 2024-08-08 MED ORDER — DIPHENHYDRAMINE HCL 50 MG/ML IJ SOLN
25.0000 mg | INTRAMUSCULAR | Status: AC
  Administered 2024-08-08: 25 mg via INTRAVENOUS
  Filled 2024-08-08: qty 1

## 2024-08-08 NOTE — ED Notes (Signed)
 PT CALLED OUT I ANSWERED THE LIGHT PT SAID SHE WAS IN A LOT OF PAIN IN [place] NEEDS SOMETHING FOR PAIN I TOLD HER I WOULD LET THE NURSE KNOW AS I WALKED OUT SHE ASKED ARE YOU NOT A NURSE I SAID NOT IM A TECH SHE STATES ONLY TECHS AND DR HAS BEEN IN ROOM SHE HASN'T SEEN THE NURSE AND REALLY NEED MEDS I TOLD RN

## 2024-08-08 NOTE — ED Provider Notes (Addendum)
 [facility] Provider Note    Event Date/Time   First MD Initiated Contact with Patient 08/08/24 0109     (approximate)   History   Emesis   HPI [name] is a 43 y.o. female who reports issues in the past multiple times over the years with abdominal pain and nausea and vomiting.  She presents for persistent vomiting for 2 days.  She states that she started vomiting a couple of days ago and has not been able to stop.  She came here yesterday to the emergency department and initially felt better after treatment but then states that as soon as she got home she started vomiting again and has not been able to keep anything down.  She said that hurts in the upper middle part of her abdomen particularly when she throws up and she feels like everything is burning.  She states that she has had an endoscopy in the past but it has been a long time and they did not find anything.  She does not currently have a gastroenterologist.     Physical Exam   Triage Vital Signs: ED Triage Vitals  Encounter Vitals Group     BP 08/07/24 2312 (!) 142/79     Girls Systolic BP Percentile --      Girls Diastolic BP Percentile --      Boys Systolic BP Percentile --      Boys Diastolic BP Percentile --      Pulse Rate 08/07/24 2312 92     Resp 08/07/24 2312 16     Temp 08/07/24 2312 99.5 F (37.5 C)     Temp Source 08/07/24 2312 Oral     SpO2 08/07/24 2312 100 %     Weight 08/07/24 2313 70 kg (154 lb 5.2 oz)     Height 08/07/24 2313 1.626 m (5' 4)     Head Circumference --      Peak Flow --      Pain Score 08/07/24 2313 6     Pain Loc --      Pain Education --      Exclude from Growth Chart --     Most recent vital signs: Vitals:   08/08/24 0330 08/08/24 0400  BP: (!) 149/87 (!) 146/84  Pulse: 93 95  Resp:    Temp:    SpO2: 97% 96%    General: Awake, appears uncomfortable, but conversant. CV:  Good peripheral perfusion.   Resp:  Normal effort. Speaking easily  and comfortably, no accessory muscle usage nor intercostal retractions.   Abd:  No distention.  Soft, mild generalized tenderness to palpation throughout the abdomen.  Nonperitoneal exam   ED Results / Procedures / Treatments   Labs (all labs ordered are listed, but only abnormal results are displayed) Labs Reviewed  CBC WITH DIFFERENTIAL/PLATELET - Abnormal; Notable for the following components:      Result Value   WBC 18.9 (*)    Neutro Abs 15.5 (*)    Monocytes Absolute 1.7 (*)    Abs Immature Granulocytes 0.11 (*)    All other components within normal limits  COMPREHENSIVE METABOLIC PANEL WITH GFR - Abnormal; Notable for the following components:   Chloride 97 (*)    Glucose, Bld 177 (*)    BUN 22 (*)    AST 13 (*)    All other components within normal limits  MAGNESIUM  - Abnormal; Notable for the following components:   Magnesium  2.5 (*)  All other components within normal limits  LIPASE, BLOOD      RADIOLOGY See ED course for details   PROCEDURES:  Critical Care performed: No  Procedures    IMPRESSION / MDM / ASSESSMENT AND PLAN / ED COURSE  I reviewed the triage vital signs and the nursing notes.                              Differential diagnosis includes, but is not limited to, cyclic vomiting syndrome, cannabinoid hyperemesis syndrome, esophagitis/gastritis, viral illness, less likely ulcer.  Patient's presentation is most consistent with acute presentation with potential threat to life or bodily function.  Labs/studies ordered: CMP, lipase, CBC with differential, magnesium   Interventions/Medications given:  Medications  droperidol (INAPSINE) 2.5 MG/ML injection 2.5 mg (2.5 mg Intravenous Given 08/08/24 0243)  diphenhydrAMINE (BENADRYL) injection 25 mg (25 mg Intravenous Given 08/08/24 0243)  alum & mag hydroxide-simeth (MAALOX/MYLANTA) 200-200-20 MG/5ML suspension 30 mL (30 mLs Oral Given 08/08/24 0243)    And  lidocaine  (XYLOCAINE ) 2 % viscous  mouth solution 15 mL (15 mLs Oral Given 08/08/24 0243)  lactated ringers  bolus 1,000 mL (1,000 mLs Intravenous New Bag/Given 08/08/24 0243)  famotidine (PEPCID) IVPB 20 mg premix (0 mg Intravenous Stopped 08/08/24 0314)  pantoprazole  (PROTONIX ) injection 40 mg (40 mg Intravenous Given 08/08/24 0243)    (Note:  hospital course my include additional interventions and/or labs/studies not listed above.)   Patient is exhibiting signs and symptoms of cyclic vomiting syndrome versus cannabinol hyperemesis syndrome, although nonspecific gastritis or esophagitis is also possible.  I reviewed the medical record and she felt better yesterday after droperidol.  We will try that again tonight and I will also add diphenhydramine, famotidine, and pantoprazole .  She also specifically mentioned that the GI cocktail with lidocaine  helped her last night so I ordered another 1 tonight.  We will try a 1 L LR fluid bolus and reassess.     Clinical Course as of 08/08/24 0434  Sat Aug 08, 2024  0331 CBC with Differential(!) Leukocytosis, slightly increased from yesterday's visit [CF]  0421 I reassessed the patient and she admits sleeping.  She gave me a big smile and I walked in and says she feels all better.  While I was in the room she drank some water and has no more vomiting.  She confirmed that she uses marijuana daily.  I talked to her about cannabinoid hyperemesis syndrome and this was new information to her.  I gave her my usual symptom management recommendations and follow-up recommendations as well as return precautions and she understands and agree.  She is appropriate for discharge and outpatient follow-up. [CF]  [phone] I reviewed the patient's lab work and they are generally reassuring results.  Magnesium  level is little bit high but just barely above the upper limit of normal.  Leukocytosis as previously described this is likely due to the recurrent vomiting. [CF]    Clinical Course User Index [CF]  Gordan Huxley, MD     FINAL CLINICAL IMPRESSION(S) / ED DIAGNOSES   Final diagnoses:  Cannabinoid hyperemesis syndrome     Rx / DC Orders   ED Discharge Orders     None        Note:  This document was prepared using Dragon voice recognition software and may include unintentional dictation errors.   Gordan Huxley, MD 08/08/24 9570    Gordan Huxley, MD 08/08/24 [phone]

## 2024-08-08 NOTE — ED Notes (Signed)
Gave pt warm blanket.

## 2024-08-08 NOTE — Discharge Instructions (Addendum)
 CHS: Patient Guide     What is cannabinoid hyperemesis syndrome (CHS)?      - CHS is a condition where people who use cannabis (marijuana) often and for a long time develop repeated bouts of severe nausea, vomiting, and belly pain. Many people feel better when taking hot showers or baths.[1][2]      - CHS symptoms usually go away for good only after stopping cannabis for an extended period.[1][2][3][4]      Common symptoms      - Waves of intense nausea and vomiting, sometimes many times a day      - Belly (abdominal) pain or cramping      - Feeling better with hot showers, baths, or heating pads      - Poor appetite, weight loss, and dehydration during episodes[1][2]      Why does this happen?      - The exact cause is still being studied. Long-term, heavy cannabis use seems to upset the body's natural systems that control nausea, stomach emptying, and temperature. Heat (hot showers) and capsaicin cream may help by activating "TRPV1" nerve receptors that calm nausea signals.[5]      - Regular anti-nausea medicines (like ondansetron ) may not work well for [facility] compared with other causes of vomiting.[1][2][6]      How is CHS diagnosed?      - There is no single test. Clinicians look for a pattern: heavy, frequent cannabis use; cycles of vomiting; relief with hot bathing; and improvement after stopping cannabis.[1][2][7]      - Stopping cannabis is key to confirming the diagnosis because symptoms typically resolve with sustained abstinence.[1][2][3][4]      The most important step: stop cannabis!    - The only known way to prevent CHS from coming back is to stop using cannabis.[1][2][3]      - It may take weeks to months of staying off cannabis for symptoms to fully settle. Expert groups suggest aiming for at least several months without cannabis; some advise 3-6 months or about three vomiting cycles to judge improvement.[3][4]      - Quitting can be hard. Support such as  counseling, behavior therapies, and, when appropriate, medicines used for mood/anxiety can help. Some clinicians use amitriptyline at night to help prevent future episodes in selected patients; dosing is started low (for example, 25 mg) and slowly increased while monitoring benefits and side effects, with typical effective doses in the 75-100 mg range in adults per specialty guidance.[8][7]      - If there are signs of cannabis use disorder, referral to counseling or addiction medicine can help maintain abstinence.[1]      Managing symptoms at home (outpatient care)      - Hydration:      - Sip clear fluids often (water, oral rehydration solutions). Small, frequent sips are easier to keep down.      - Seek medical care for signs of dehydration: very dark urine, dizziness, fainting, or inability to keep fluids down for 24 hours.[1]      - Heat therapy:      - Short, warm-to-hot showers or baths often give temporary relief. Do not scald the skin. Take breaks to avoid overheating or fainting.[1][2][5][9]      - Topical capsaicin cream:      - Over-the-counter capsaicin cream (for example, 0.025%-0.1%) applied as a thin layer to the upper belly or the back of the arms may reduce nausea and the need for other medicines. A small  pea-sized amount can be spread over the upper abdomen; wash hands well after use. Expect a strong warming/burning feeling on the skin that usually fades.[8][1][5][9]      - Do not use on broken skin, and avoid eyes and genitals. Stop if there is a rash or severe burning.      - Eating:      - Try bland foods (bananas, rice, applesauce, toast) as symptoms ease. Avoid spicy, fatty, and very large meals during recovery.      - Medicines at home:      - Some people still get partial relief from standard anti-nausea medicines such as ondansetron  or promethazine  prescribed by a clinician, but they often work less well in [facility] than in other conditions.[1][6][2]      - Avoid  opioids (narcotics) for belly pain because they can worsen nausea, slow the gut, and carry addiction risk.[8][1]      - Benzodiazepines (such as lorazepam) and certain antipsychotics (such as haloperidol or droperidol) are sometimes used under medical supervision, usually in urgent care or the emergency department, not as first choices for home use.[10][1][2][6]      What to expect in the clinic or emergency department      - Fluids by mouth or IV for dehydration; blood tests if needed.      - Medicines that may help CHS more than standard options include haloperidol or droperidol, and topical capsaicin in the department. Benzodiazepines may be used in selected cases but are not first-line.[10][1][2][6][9]      - Opioids are generally avoided.[8][1]      Preventing future episodes      - Stop cannabis completely. This is the most reliable way to prevent CHS from returning.[1][2][3]      - Work with clinicians on a quit plan. If anxiety, sleep problems, or cravings occur after stopping, let the care team know; supports are available.[1][8]      - For people with frequent relapses or ongoing symptoms, clinicians may discuss nightly amitriptyline with careful dose increases and monitoring, and a plan to taper after a sustained symptom-free period off cannabis.[8][7]      When to seek urgent care      - Can't keep any fluids down for 24 hours      - Signs of dehydration: very dark urine or not urinating, dizziness, fast heartbeat, fainting      - Vomiting blood or coffee-ground material      - Severe belly pain, fever, stiff neck, confusion, or chest pain      - Pregnancy (or possible pregnancy) with vomiting: cannabis should be avoided during pregnancy and breastfeeding due to potential risks to the baby.[2]      Key takeaways      - CHS happens in people who use cannabis often and for a long time and leads to cycles of vomiting and belly pain.[1][2]      - Hot showers and capsaicin  cream can help symptoms short term.[5][8][1]      - Standard anti-nausea medicines may not work as well; some prescription options are used in clinics or emergency departments.[1][2][10][6]      - The only proven long-term fix is to stop cannabis.[1][2][3][4]      ### References  1. Guidelines for Reasonable and Appropriate Care in the Emergency Department HORTENSE): Alcohol Use Disorder and Cannabinoid Hyperemesis Syndrome Management in the Emergency Department. Cooper KATHEE Salvadore JULIANNA Buren LILLETTE, et al. Academic Emergency Medicine : Official Journal of  the Society for Academic Emergency Medicine. 2024;31(5):425-455. doi:10.1111/acem.14911. 2. Cannabis-Related Disorders and Toxic Effects. Gorelick DA. The [place] England Journal of Medicine. 2023;389(24):2267-2275. doi:10.1056/NEJMra2212152. 3. [employer] System for Pediatric Gastroenterology, Hepatology, and Nutrition 2025 Guidelines for Management of Cyclic Vomiting Syndrome in Children. Karrento K, Rosen JM, Tarbell SE, et al. Journal of Pediatric Gastroenterology and Nutrition. 2025;80(6):1028-1061. doi:10.1002/jpn3.70020. 4. Proper Counseling for Diagnosis and Management of Cannabinoid Hyperemesis Syndrome: A Case Report. Cholette-Ttrault S, Grad R. Family Practice. 2025;42(2):cmae067. doi:10.1093/fampra/cmae067. 5. Cannabinoid Hyperemesis Syndrome: Potential Mechanisms for the Benefit of Capsaicin and Hot Water Hydrotherapy in Treatment. Valere RADDLE, Lapoint JM, Burillo-Putze G. Clinical Toxicology [facility] 2018;56(1):15-24. doi:10.1080/15563650.2017.1349910. 6. Pharmacological Management of Cannabinoid Hyperemesis Syndrome: An Update of the Clinical Literature. Burillo-Putze G, Richards JR, Rodrguez-Jimnez C, Sanchez-Agera A. Expert Opinion on Pharmacotherapy. 2022;23(6):693-702. doi:10.1080/14656566.2022.2049237. 7. Cannabinoid Hyperemesis Syndrome: Definition, Pathophysiology, Clinical Spectrum, Insights Into Acute and  Long-Term Management. Jonette HERO, Sifuentes J, Bashashati M, McCallum R. Journal of Investigative Medicine : The Official Publication of the [employer]. 2020;68(8):1309-1316. doi:10.1136/jim-2020-001564. 8. AGA Clinical Practice Update on Diagnosis and Management of Cannabinoid Hyperemesis Syndrome: Commentary. Rubio-Tapia A, McCallum R, Camilleri M. Gastroenterology. 2024;166(5):930-934.e1. doi:10.1053/j.gastro.2024.01.040. 9. Pharmacologic Treatment of Cannabinoid Hyperemesis Syndrome: A Systematic Review. Valere RADDLE, Gordon BK, Danielson AR, Moulin AK. Pharmacotherapy. 2017;37(6):725-734. doi:10.1002/phar.1931. 10. Managing Cannabinoid Hyperemesis Syndrome in Adult Patients in the Emergency Department. Won K, Celmins L. American Journal of Health-System Pharmacy : AJHP : Official Journal of the [employer]. 2025;:zxaf125. doi:10.1093/ajhp/zxaf125.

## 2024-08-08 NOTE — ED Notes (Signed)
 This tech answered pt's call light, pt states that she needs something for pain for her stomach and this tech advised pt that she would let her nurse know.    Pt called out a second time and stated that she still needed pain medicine and that her nurse had not been in there. This tech let pt know that her nurse was busy in another room and that she would let her know as soon as she is done with the pt she is helping. Pt stated that she is in a lot of pain and that she needs to see either the nurse or the doctor soon because she is in so much pain and that she will continue to press the call light until someone comes in there. Pt then asked for another nurse, this tech told her that she cannot switch nurses and that every nurse has their assigned rooms. This tech advised pt that nurse and the doctor will be in the room as soon as they can. Primary RN is aware of encounter.

## 2024-08-09 ENCOUNTER — Emergency Department
Admission: EM | Admit: 2024-08-09 | Discharge: 2024-08-09 | Disposition: A | Discharge status: Home / Self Care | Attending: Emergency Medicine | Admitting: Emergency Medicine

## 2024-08-09 ENCOUNTER — Emergency Department

## 2024-08-09 ENCOUNTER — Other Ambulatory Visit: Payer: Self-pay

## 2024-08-09 DIAGNOSIS — R1115 Cyclical vomiting syndrome unrelated to migraine: Secondary | ICD-10-CM | POA: Insufficient documentation

## 2024-08-09 DIAGNOSIS — R1013 Epigastric pain: Secondary | ICD-10-CM | POA: Insufficient documentation

## 2024-08-09 DIAGNOSIS — R112 Nausea with vomiting, unspecified: Secondary | ICD-10-CM | POA: Diagnosis present

## 2024-08-09 DIAGNOSIS — E119 Type 2 diabetes mellitus without complications: Secondary | ICD-10-CM | POA: Diagnosis not present

## 2024-08-09 DIAGNOSIS — F1210 Cannabis abuse, uncomplicated: Secondary | ICD-10-CM | POA: Diagnosis not present

## 2024-08-09 LAB — COMPREHENSIVE METABOLIC PANEL WITH GFR
ALT: 11 U/L (ref 0–44)
AST: 19 U/L (ref 15–41)
Albumin: 4 g/dL (ref 3.5–5.0)
Alkaline Phosphatase: 56 U/L (ref 38–126)
Anion gap: 17 — ABNORMAL HIGH (ref 5–15)
BUN: 22 mg/dL — ABNORMAL HIGH (ref 6–20)
CO2: 24 mmol/L (ref 22–32)
Calcium: 9.2 mg/dL (ref 8.9–10.3)
Chloride: 94 mmol/L — ABNORMAL LOW (ref 98–111)
Creatinine, Ser: 0.71 mg/dL (ref 0.44–1.00)
GFR, Estimated: >60 mL/min (ref >60)
Glucose, Bld: 162 mg/dL — ABNORMAL HIGH (ref 70–99)
Potassium: 3.9 mmol/L (ref 3.5–5.1)
Sodium: 136 mmol/L (ref 135–145)
Total Bilirubin: 0.6 mg/dL (ref 0.0–1.2)
Total Protein: 7.8 g/dL (ref 6.5–8.1)

## 2024-08-09 LAB — CBC WITH DIFFERENTIAL/PLATELET
Abs Immature Granulocytes: 0.07 K/uL (ref 0.00–0.07)
Basophils Absolute: 0 K/uL (ref 0.0–0.1)
Basophils Relative: 0 %
Eosinophils Absolute: 0 K/uL (ref 0.0–0.5)
Eosinophils Relative: 0 %
HCT: 40.6 % (ref 36.0–46.0)
Hemoglobin: 13.7 g/dL (ref 12.0–15.0)
Immature Granulocytes: 1 %
Lymphocytes Relative: 19 %
Lymphs Abs: 2.5 K/uL (ref 0.7–4.0)
MCH: 30.5 pg (ref 26.0–34.0)
MCHC: 33.7 g/dL (ref 30.0–36.0)
MCV: 90.4 fL (ref 80.0–100.0)
Monocytes Absolute: 1.1 K/uL — ABNORMAL HIGH (ref 0.1–1.0)
Monocytes Relative: 9 %
Neutro Abs: 9.2 K/uL — ABNORMAL HIGH (ref 1.7–7.7)
Neutrophils Relative %: 71 %
Platelets: 362 K/uL (ref 150–400)
RBC: 4.49 MIL/uL (ref 3.87–5.11)
RDW: 12.5 % (ref 11.5–15.5)
WBC: 12.9 K/uL — ABNORMAL HIGH (ref 4.0–10.5)
nRBC: 0 % (ref 0.0–0.2)

## 2024-08-09 LAB — MAGNESIUM: Magnesium: 2.5 mg/dL — ABNORMAL HIGH (ref 1.7–2.4)

## 2024-08-09 LAB — LIPASE, BLOOD: Lipase: 18 U/L (ref 11–51)

## 2024-08-09 MED ORDER — ALUM & MAG HYDROXIDE-SIMETH 200-200-20 MG/5ML PO SUSP
30.0000 mL | Freq: Once | ORAL | Status: AC
  Administered 2024-08-09: 30 mL via ORAL
  Filled 2024-08-09: qty 30

## 2024-08-09 MED ORDER — PANTOPRAZOLE SODIUM 40 MG IV SOLR
40.0000 mg | Freq: Once | INTRAVENOUS | Status: AC
  Administered 2024-08-09: 40 mg via INTRAVENOUS
  Filled 2024-08-09: qty 10

## 2024-08-09 MED ORDER — OMEPRAZOLE MAGNESIUM 20 MG PO TBEC: 20.0000 mg | DELAYED_RELEASE_TABLET | Freq: Every day | ORAL | 3 refills | Status: DC

## 2024-08-09 MED ORDER — SODIUM CHLORIDE 0.9 % IV BOLUS
1000.0000 mL | Freq: Once | INTRAVENOUS | Status: AC
  Administered 2024-08-09: 1000 mL via INTRAVENOUS

## 2024-08-09 MED ORDER — SUCRALFATE 1 GM/10ML PO SUSP: 1.0000 g | Freq: Four times a day (QID) | ORAL | 11 refills | Status: DC

## 2024-08-09 MED ORDER — LIDOCAINE VISCOUS HCL 2 % MT SOLN
15.0000 mL | Freq: Once | OROMUCOSAL | Status: AC
  Administered 2024-08-09: 15 mL via ORAL
  Filled 2024-08-09: qty 15

## 2024-08-09 MED ORDER — DIPHENHYDRAMINE HCL 50 MG/ML IJ SOLN
25.0000 mg | Freq: Once | INTRAMUSCULAR | Status: AC
  Administered 2024-08-09: 25 mg via INTRAVENOUS
  Filled 2024-08-09: qty 1

## 2024-08-09 MED ORDER — PROMETHAZINE HCL 25 MG RE SUPP: 25.0000 mg | Freq: Four times a day (QID) | RECTAL | 0 refills | Status: DC | PRN

## 2024-08-09 MED ORDER — DROPERIDOL 2.5 MG/ML IJ SOLN
2.5000 mg | Freq: Once | INTRAMUSCULAR | Status: AC
  Administered 2024-08-09: 2.5 mg via INTRAVENOUS
  Filled 2024-08-09: qty 2

## 2024-08-09 NOTE — ED Triage Notes (Signed)
 Pt presents for abdominal pain and vomiting. Seen and discharged today for same. Pts family states we are just going to keep coming until yall find something. Took promethazine  at home and got mild relief but states I don't get the relief until I get it through my IV.

## 2024-08-09 NOTE — ED Notes (Signed)
Passed PO challenge.  MD notified.

## 2024-08-09 NOTE — ED Notes (Signed)
 No orders placed at this time as pt would like to wait until she is in her room to get her IV

## 2024-08-09 NOTE — ED Notes (Signed)
 PO challenging with crackers and water.

## 2024-08-09 NOTE — Discharge Instructions (Addendum)
 Take previously prescribed promethazine  for nausea and vomiting as needed. Take omeprazole and Carafate for stomach acid related pain, as prescribed. Try the rectal promethazine  as we described.   I made a referral to a gastroenterologist who can schedule a follow-up appointment for you.  You may have some gallbladder sludge which may contribute to your symptoms but no evidence of infection that would need surgery right away.  You can talk to Dr. Cesar who is a gallbladder specialist for further evaluation and treatment options.  Drink plenty of fluids to stay well-hydrated.  Find Pedialyte or similar electrolyte rehydration formulas at your local pharmacy.  We talked about cannabis hyperemesis syndrome, and to test this theory it is important that you stop using cannabis for a prolonged period of time 6 months to a year to see if this helps with your vomiting syndrome.  Thank you for choosing us  for your health care today!  Please see your primary doctor this week for a follow up appointment.   If you have any new, worsening, or unexpected symptoms call your doctor right away or come back to the emergency department for reevaluation.  It was my pleasure to care for you today.   Ginnie EDISON Cyrena, MD

## 2024-08-09 NOTE — ED Provider Notes (Signed)
 [facility] Provider Note    Event Date/Time   First MD Initiated Contact with Patient 08/09/24 [phone]     (approximate)   History   Abdominal Pain   HPI  [name] is a 43 y.o. female   Past medical history of type II diabetic, hyperlipidemia, cannabis hyperemesis syndrome who presents to the emergency department with ongoing nausea vomiting and upper abdominal cramping pain for the last several days.  Has been seen in the emergency department multiple times and had had labs performed which were unremarkable and has had symptom improvement with GI cocktail and antiemetics, fluids.  Recurrence of symptoms tonight brought her back in.  Similar to prior in terms of locations quality severity.  No GI bleeding.  Epigastric crampy pain only.  No urinary symptoms.  Independent Historian contributed to assessment above: Husband corroborates information and past medical history as above  External Medical Documents Reviewed: Prior hospital notes      Physical Exam   Triage Vital Signs: ED Triage Vitals  Encounter Vitals Group     BP 08/09/24 0200 (!) 138/91     Girls Systolic BP Percentile --      Girls Diastolic BP Percentile --      Boys Systolic BP Percentile --      Boys Diastolic BP Percentile --      Pulse Rate 08/09/24 0200 93     Resp 08/09/24 0200 20     Temp 08/09/24 0200 98.7 F (37.1 C)     Temp Source 08/09/24 0200 Oral     SpO2 08/09/24 0200 100 %     Weight 08/09/24 0201 154 lb 5.2 oz (70 kg)     Height 08/09/24 0201 5' 4 (1.626 m)     Head Circumference --      Peak Flow --      Pain Score 08/09/24 0201 4     Pain Loc --      Pain Education --      Exclude from Growth Chart --     Most recent vital signs: Vitals:   08/09/24 0515 08/09/24 0600  BP: 130/83 126/77  Pulse: 87   Resp: 17   Temp: 98 F (36.7 C)   SpO2: 99%     General: Awake, no distress.  CV:  Good peripheral perfusion.  Resp:  Normal effort.   Abd:  No distention.  Other:  Awake alert comfortable appearing normal vital signs.  Neck supple full range of motion.  No fever.  Mild epigastric tenderness to palpation without rigidity or guarding.  Dry mucous membranes appears slightly dehydrated.   ED Results / Procedures / Treatments   Labs (all labs ordered are listed, but only abnormal results are displayed) Labs Reviewed  COMPREHENSIVE METABOLIC PANEL WITH GFR - Abnormal; Notable for the following components:      Result Value   Chloride 94 (*)    Glucose, Bld 162 (*)    BUN 22 (*)    Anion gap 17 (*)    All other components within normal limits  CBC WITH DIFFERENTIAL/PLATELET - Abnormal; Notable for the following components:   WBC 12.9 (*)    Neutro Abs 9.2 (*)    Monocytes Absolute 1.1 (*)    All other components within normal limits  MAGNESIUM  - Abnormal; Notable for the following components:   Magnesium  2.5 (*)    All other components within normal limits  LIPASE, BLOOD     I ordered  and reviewed the above labs they are notable for cell counts and electrolytes largely unremarkable    RADIOLOGY I independently reviewed and interpreted ultrasound of the abdomen and see no evidence of cholecystitis I also reviewed radiologist's formal read.   PROCEDURES:  Critical Care performed: No  Procedures   MEDICATIONS ORDERED IN ED: Medications  droperidol (INAPSINE) 2.5 MG/ML injection 2.5 mg (2.5 mg Intravenous Given 08/09/24 0358)  sodium chloride  0.9 % bolus 1,000 mL (0 mLs Intravenous Stopped 08/09/24 0518)  diphenhydrAMINE (BENADRYL) injection 25 mg (25 mg Intravenous Given 08/09/24 0356)  alum & mag hydroxide-simeth (MAALOX/MYLANTA) 200-200-20 MG/5ML suspension 30 mL (30 mLs Oral Given 08/09/24 0354)    And  lidocaine  (XYLOCAINE ) 2 % viscous mouth solution 15 mL (15 mLs Oral Given 08/09/24 0354)  pantoprazole  (PROTONIX ) injection 40 mg (40 mg Intravenous Given 08/09/24 0351)     IMPRESSION / MDM /  ASSESSMENT AND PLAN / ED COURSE  I reviewed the triage vital signs and the nursing notes.                                Patient's presentation is most consistent with acute presentation with potential threat to life or bodily function.  Differential diagnosis includes, but is not limited to, cyclic vomiting, cannabis related hyperemesis syndrome, electrolyte derangements, DKA, intra-abdominal infections or obstruction   The patient is on the cardiac monitor to evaluate for evidence of arrhythmia and/or significant heart rate changes.  MDM:    Relatively benign abdominal exam rules against surgical abdominal pathologies but given the intermittent nature of her pain associated with vomiting, and no gallbladder ultrasound on file, will check for cholelithiasis or cholecystitis.  Check basic labs including LFTs, electrolytes, sugar for electrolyte derangements or DKA.  No urinary symptoms so doubt urinary tract infection, defer UA.  Pregnancy test negative yesterday deferred testing today.  Discussed in length the possibility for cannabis related hyperemesis syndrome, and advised for long-term cessation to test this theory.  She has had good effect with lidocaine /Maalox and we will give the same today as well as droperidol and fluids.  Will start her on PPI.  Will refer to gastroenterology.   I considered hospitalization for admission or observation however given unremarkable workup and suspicion for cyclic vomiting syndrome I anticipate discharge home should labs be unremarkable and her symptoms be improved with medication treatment with the plan for medications as above, and GI follow-up.  Tolerating p.o. now.  Ultrasound shows no cholecystitis, perhaps some gallbladder sludge.  Can follow-up outpatient with GI regarding cyclic vomiting and general surgery for the gallbladder sludge.  Discharged with return precautions      FINAL CLINICAL IMPRESSION(S) / ED DIAGNOSES   Final  diagnoses:  Cyclic vomiting syndrome  Cannabis abuse     Rx / DC Orders   ED Discharge Orders          Ordered    omeprazole (PRILOSEC OTC) 20 MG tablet  Daily        08/09/24 0306    sucralfate (CARAFATE) 1 GM/10ML suspension  4 times daily        08/09/24 0306    Ambulatory referral to Gastroenterology        08/09/24 0308    promethazine  (PHENERGAN ) 25 MG suppository  Every 6 hours PRN        08/09/24 0619             Note:  This document was prepared using Dragon voice recognition software and may include unintentional dictation errors.    Cyrena Mylar, MD 08/09/24 [phone]

## 2024-08-10 ENCOUNTER — Ambulatory Visit (INDEPENDENT_AMBULATORY_CARE_PROVIDER_SITE_OTHER): Admitting: Cardiology

## 2024-08-10 ENCOUNTER — Ambulatory Visit: Admitting: Cardiology

## 2024-08-10 ENCOUNTER — Encounter: Payer: Self-pay | Admitting: Cardiology

## 2024-08-10 VITALS — BP 126/62 | HR 94 | Ht 65.0 in | Wt 149.0 lb

## 2024-08-10 DIAGNOSIS — E1165 Type 2 diabetes mellitus with hyperglycemia: Secondary | ICD-10-CM

## 2024-08-10 DIAGNOSIS — K829 Disease of gallbladder, unspecified: Secondary | ICD-10-CM

## 2024-08-10 DIAGNOSIS — R1013 Epigastric pain: Secondary | ICD-10-CM | POA: Diagnosis not present

## 2024-08-10 DIAGNOSIS — R1115 Cyclical vomiting syndrome unrelated to migraine: Secondary | ICD-10-CM | POA: Diagnosis not present

## 2024-08-10 DIAGNOSIS — Z0130 Encounter for examination of blood pressure without abnormal findings: Secondary | ICD-10-CM

## 2024-08-10 DIAGNOSIS — E119 Type 2 diabetes mellitus without complications: Secondary | ICD-10-CM

## 2024-08-10 MED ORDER — PROMETHAZINE HCL 25 MG RE SUPP: 25.0000 mg | Freq: Four times a day (QID) | RECTAL | 0 refills | Status: DC | PRN

## 2024-08-10 MED ORDER — TRAZODONE HCL 50 MG/5ML PO SOLN: 5.0000 mL | Freq: Every evening | ORAL | 1 refills | Status: DC | PRN

## 2024-08-10 NOTE — Progress Notes (Unsigned)
 Established Patient Office Visit  Subjective:  Patient ID: [name], female    DOB: 10-17-1980  Age: 43 y.o. MRN: [id number]  Chief Complaint  Patient presents with   Follow-up    Hospital follow up    Patient in office for hospital follow up. Patient went to ED on 08/06/24, 08/08/24, and 08/09/24 all with complaints of abdominal pain, nausea and vomiting. RUQ ultrasound on 08/09/24 revealed fixed non-shadowing mural nodule within the gallbladder fundus, possibly representing a gallbladder polyp or adherent sludge. Follow-up sonography in 6 months is recommended to ensure stability or resolution. No acute findings. Repeat ultrasound ordered for 6 months. Patient presents today complaining of ongoing nausea vomiting and upper abdominal cramping pain. Patient reports only sleeping when she takes a phenergan  suppository and Carafate. Only vomiting after she eats something.  Recommend trying a bland diet, toast. Small sips of water and electrolyte drink. Continue phenergan , Carafate. Patient not currently taking her Syngardy due to not eating. Patient checking her blood sugars at home. Patient understands if vomiting continues, or pain worsens she should go to the ED.     No other concerns at this time.   Past Medical History:  Diagnosis Date   Anemia    Anxiety    Diabetes mellitus without complication (HCC)    last A1C 5.1   Heart murmur    was seen by cardiology, benign, no follow up needed   Onychomycosis    SIRS (systemic inflammatory response syndrome) (HCC) 07/09/2021    Past Surgical History:  Procedure Laterality Date   DILATATION & CURETTAGE/HYSTEROSCOPY WITH MYOSURE  08/29/2022   Procedure: HYSTEROSCOPY WITH MYOSURE RESECTION OF UTERINE FIBROIDS;  Surgeon: Rutherford Gain, MD;  Location: [facility];  Service: Gynecology;;   DILATATION & CURRETTAGE/HYSTEROSCOPY WITH RESECTOCOPE N/A 12/07/2022   Procedure: DILATATION & CURETTAGE/HYSTEROSCOPY WITH RESECTOCOPE;  Surgeon:  Rutherford Gain, MD;  Location: [facility];  Service: Gynecology;  Laterality: N/A;   DILATION AND CURETTAGE OF UTERUS N/A 08/29/2022   Procedure: ULTRASOUND GUIDED  SUCTION DILATATION AND CURETTAGE,  EXCISION PROLASPED UTERINE FIBROID;  Surgeon: Rutherford Gain, MD;  Location: [facility];  Service: Gynecology;  Laterality: N/A;   NO PAST SURGERIES     OPERATIVE ULTRASOUND N/A 08/29/2022   Procedure: OPERATIVE ULTRASOUND;  Surgeon: Rutherford Gain, MD;  Location: [facility];  Service: Gynecology;  Laterality: N/A;    Social History   Socioeconomic History   Marital status: Significant Other    Spouse name: Not on file   Number of children: 1   Years of education: Not on file   Highest education level: Not on file  Occupational History   Not on file  Tobacco Use   Smoking status: Never   Smokeless tobacco: Never  Vaping Use   Vaping status: Never Used  Substance and Sexual Activity   Alcohol use: Yes    Comment: occ   Drug use: Never   Sexual activity: Not Currently  Other Topics Concern   Not on file  Social History Narrative   Not on file   Social Drivers of Health   Financial Resource Strain: Not on file  Food Insecurity: Not on file  Transportation Needs: Not on file  Physical Activity: Not on file  Stress: Not on file  Social Connections: Not on file  Intimate Partner Violence: Not on file    Family History  Problem Relation Age of Onset   Hypertension Mother    Diabetes Mother    Diabetes Maternal  Grandmother     No Known Allergies  Outpatient Medications Prior to Visit  Medication Sig   atorvastatin (LIPITOR) 20 MG tablet Take 1 tablet (20 mg total) by mouth daily.   BLACK CURRANT SEED OIL PO Take by mouth.   Elderberry 500 MG CAPS Take by mouth. (Patient taking differently: Take by mouth as needed.)   ferrous sulfate  325 (65 FE) MG tablet TAKE 1 TABLET BY MOUTH TWICE A DAY (Patient taking differently: Take 325 mg by mouth as needed.)    Fluocinolone Acetonide Body 0.01 % OIL Apply topically every other day.   ibuprofen  (ADVIL ) 800 MG tablet Take 1 tablet (800 mg total) by mouth every 8 (eight) hours as needed for mild pain or moderate pain.   magnesium  gluconate (MAGONATE) 500 MG tablet Take 500 mg by mouth daily.   NON FORMULARY soursop   Omega-3 Fatty Acids (FISH OIL PO) Take 2 tablets by mouth daily. 2 grams daily   omeprazole (PRILOSEC OTC) 20 MG tablet Take 1 tablet (20 mg total) by mouth daily.   ondansetron  (ZOFRAN ) 4 MG tablet Take 1 tablet (4 mg total) by mouth every 6 (six) hours as needed for up to 7 days for nausea or vomiting.   promethazine  (PHENERGAN ) 12.5 MG tablet Take 1 tablet (12.5 mg total) by mouth every 6 (six) hours as needed for up to 5 days for nausea or vomiting.   spironolactone (ALDACTONE) 50 MG tablet Take 50 mg by mouth daily.   sucralfate (CARAFATE) 1 GM/10ML suspension Take 10 mLs (1 g total) by mouth 4 (four) times daily.   SYNJARDY XR 12.01-999 MG TB24 Take 1 tablet by mouth every morning.   tretinoin (RETIN-A) 0.025 % cream Apply topically at bedtime.   triamcinolone ointment (KENALOG) 0.5 % Apply 1 Application topically 2 (two) times daily.   [DISCONTINUED] promethazine  (PHENERGAN ) 25 MG suppository Place 1 suppository (25 mg total) rectally every 6 (six) hours as needed for nausea or vomiting.   No facility-administered medications prior to visit.    Review of Systems  Constitutional: Negative.   HENT: Negative.    Eyes: Negative.   Respiratory: Negative.  Negative for shortness of breath.   Cardiovascular: Negative.  Negative for chest pain.  Gastrointestinal:  Positive for abdominal pain, diarrhea, heartburn, nausea and vomiting. Negative for constipation.  Genitourinary: Negative.   Musculoskeletal:  Negative for joint pain and myalgias.  Skin: Negative.   Neurological: Negative.  Negative for dizziness and headaches.  Endo/Heme/Allergies: Negative.   All other systems reviewed  and are negative.      Objective:   BP 126/62   Pulse 94   Ht 5' 5 (1.651 m)   Wt 149 lb (67.6 kg)   LMP 07/14/2024 (Approximate)   SpO2 96%   BMI 24.79 kg/m   Vitals:   08/10/24 1447  BP: 126/62  Pulse: 94  Height: 5' 5 (1.651 m)  Weight: 149 lb (67.6 kg)  SpO2: 96%  BMI (Calculated): 24.79    Physical Exam Vitals and nursing note reviewed.  Constitutional:      Appearance: Normal appearance. She is normal weight.  HENT:     Head: Normocephalic and atraumatic.     Nose: Nose normal.     Mouth/Throat:     Mouth: Mucous membranes are moist.  Eyes:     Extraocular Movements: Extraocular movements intact.     Conjunctiva/sclera: Conjunctivae normal.     Pupils: Pupils are equal, round, and reactive to light.  Cardiovascular:  Rate and Rhythm: Normal rate and regular rhythm.     Pulses: Normal pulses.     Heart sounds: Normal heart sounds.  Pulmonary:     Effort: Pulmonary effort is normal.     Breath sounds: Normal breath sounds.  Abdominal:     General: Abdomen is flat. Bowel sounds are normal.     Palpations: Abdomen is soft.  Musculoskeletal:        General: Normal range of motion.     Cervical back: Normal range of motion.  Skin:    General: Skin is warm and dry.  Neurological:     General: No focal deficit present.     Mental Status: She is alert and oriented to person, place, and time.  Psychiatric:        Mood and Affect: Mood normal.        Behavior: Behavior normal.        Thought Content: Thought content normal.        Judgment: Judgment normal.      No results found for any visits on 08/10/24.  Recent Results (from the past 2160 hours)  Hepatic function panel     Status: None   Collection Time: 05/20/24  8:06 AM  Result Value Ref Range   Total Protein 7.8 6.0 - 8.5 g/dL   Albumin 4.7 3.9 - 4.9 g/dL   Bilirubin Total 0.4 0.0 - 1.2 mg/dL   Bilirubin, Direct 9.85 0.00 - 0.40 mg/dL   Alkaline Phosphatase 61 44 - 121 IU/L   AST 18 0  - 40 IU/L   ALT 28 0 - 32 IU/L  CMP14+EGFR     Status: Abnormal   Collection Time: 08/03/24  8:21 AM  Result Value Ref Range   Glucose 144 (H) 70 - 99 mg/dL   BUN 22 6 - 24 mg/dL   Creatinine, Ser 9.05 0.57 - 1.00 mg/dL   eGFR 77 >40 fO/fpw/8.26   BUN/Creatinine Ratio 23 9 - 23   Sodium 138 134 - 144 mmol/L   Potassium 4.4 3.5 - 5.2 mmol/L   Chloride 101 96 - 106 mmol/L   CO2 22 20 - 29 mmol/L   Calcium  10.2 8.7 - 10.2 mg/dL   Total Protein 7.7 6.0 - 8.5 g/dL   Albumin 4.7 3.9 - 4.9 g/dL   Globulin, Total 3.0 1.5 - 4.5 g/dL   Bilirubin Total 0.4 0.0 - 1.2 mg/dL   Alkaline Phosphatase 62 41 - 116 IU/L   AST 12 0 - 40 IU/L   ALT 20 0 - 32 IU/L  Hemoglobin A1c     Status: Abnormal   Collection Time: 08/03/24  8:21 AM  Result Value Ref Range   Hgb A1c MFr Bld 7.2 (H) 4.8 - 5.6 %    Comment:          Prediabetes: 5.7 - 6.4          Diabetes: >6.4          Glycemic control for adults with diabetes: <7.0    Est. average glucose Bld gHb Est-mCnc 160 mg/dL  Lipid Profile     Status: Abnormal   Collection Time: 08/03/24  8:21 AM  Result Value Ref Range   Cholesterol, Total 188 100 - 199 mg/dL   Triglycerides 896 0 - 149 mg/dL   HDL 59 >60 mg/dL   VLDL Cholesterol Cal 18 5 - 40 mg/dL   LDL Chol Calc (NIH) 888 (H) 0 - 99 mg/dL   Chol/HDL Ratio  3.2 0.0 - 4.4 ratio    Comment:                                   T. Chol/HDL Ratio                                             Men  Women                               1/2 Avg.Risk  3.4    3.3                                   Avg.Risk  5.0    4.4                                2X Avg.Risk  9.6    7.1                                3X Avg.Risk 23.4   11.0   TSH     Status: None   Collection Time: 08/03/24  8:21 AM  Result Value Ref Range   TSH 1.250 0.450 - 4.500 uIU/mL  Lipase, blood     Status: None   Collection Time: 08/06/24  5:35 PM  Result Value Ref Range   Lipase 15 11 - 51 U/L    Comment: Performed at [facility],  [address] [place], [place] [address]  Comprehensive metabolic panel     Status: Abnormal   Collection Time: 08/06/24  5:35 PM  Result Value Ref Range   Sodium 139 135 - 145 mmol/L   Potassium 3.9 3.5 - 5.1 mmol/L   Chloride 100 98 - 111 mmol/L   CO2 19 (L) 22 - 32 mmol/L   Glucose, Bld 189 (H) 70 - 99 mg/dL    Comment: Glucose reference range applies only to samples taken after fasting for at least 8 hours.   BUN 13 6 - 20 mg/dL   Creatinine, Ser 9.17 0.44 - 1.00 mg/dL   Calcium  9.5 8.9 - 10.3 mg/dL   Total Protein 8.3 (H) 6.5 - 8.1 g/dL   Albumin 4.5 3.5 - 5.0 g/dL   AST 15 15 - 41 U/L   ALT 16 0 - 44 U/L   Alkaline Phosphatase 63 38 - 126 U/L   Total Bilirubin 0.5 0.0 - 1.2 mg/dL   GFR, Estimated >39 >39 mL/min    Comment: (NOTE) Calculated using the CKD-EPI Creatinine Equation (2021)    Anion gap 20 (H) 5 - 15    Comment: Performed at [facility], [address] [place], [place] [address]  CBC     Status: Abnormal   Collection Time: 08/06/24  5:35 PM  Result Value Ref Range   WBC 17.0 (H) 4.0 - 10.5 K/uL   RBC 4.28 3.87 - 5.11 MIL/uL   Hemoglobin 13.1 12.0 - 15.0 g/dL   HCT 60.5 63.9 - 53.9 %   MCV 92.1 80.0 - 100.0 fL   MCH 30.6 26.0 -  34.0 pg   MCHC 33.2 30.0 - 36.0 g/dL   RDW 86.7 88.4 - 84.4 %   Platelets 321 150 - 400 K/uL   nRBC 0.0 0.0 - 0.2 %    Comment: Performed at [facility], [address] [place], [place] [address]  CBG monitoring, ED     Status: Abnormal   Collection Time: 08/07/24  4:00 AM  Result Value Ref Range   Glucose-Capillary 183 (H) 70 - 99 mg/dL    Comment: Glucose reference range applies only to samples taken after fasting for at least 8 hours.  Lipase, blood     Status: None   Collection Time: 08/07/24  4:02 AM  Result Value Ref Range   Lipase 14 11 - 51 U/L    Comment: Performed at [facility], [address] [place], [place] [address]  Comprehensive metabolic panel     Status: Abnormal    Collection Time: 08/07/24  4:02 AM  Result Value Ref Range   Sodium 138 135 - 145 mmol/L    Comment: Electrolytes repeated to verify    Potassium 4.0 3.5 - 5.1 mmol/L   Chloride 97 (L) 98 - 111 mmol/L   CO2 19 (L) 22 - 32 mmol/L   Glucose, Bld 202 (H) 70 - 99 mg/dL    Comment: Glucose reference range applies only to samples taken after fasting for at least 8 hours.   BUN 16 6 - 20 mg/dL   Creatinine, Ser 9.14 0.44 - 1.00 mg/dL   Calcium  9.8 8.9 - 10.3 mg/dL   Total Protein 8.5 (H) 6.5 - 8.1 g/dL   Albumin 4.6 3.5 - 5.0 g/dL   AST 16 15 - 41 U/L   ALT 16 0 - 44 U/L   Alkaline Phosphatase 62 38 - 126 U/L   Total Bilirubin 0.4 0.0 - 1.2 mg/dL   GFR, Estimated >39 >39 mL/min    Comment: (NOTE) Calculated using the CKD-EPI Creatinine Equation (2021)    Anion gap 22 (H) 5 - 15    Comment: Performed at [facility], [address] [place], [place] [address]  CBC     Status: Abnormal   Collection Time: 08/07/24  4:02 AM  Result Value Ref Range   WBC 15.1 (H) 4.0 - 10.5 K/uL   RBC 4.26 3.87 - 5.11 MIL/uL   Hemoglobin 13.2 12.0 - 15.0 g/dL   HCT 61.0 63.9 - 53.9 %   MCV 91.3 80.0 - 100.0 fL   MCH 31.0 26.0 - 34.0 pg   MCHC 33.9 30.0 - 36.0 g/dL   RDW 86.7 88.4 - 84.4 %   Platelets 336 150 - 400 K/uL   nRBC 0.0 0.0 - 0.2 %    Comment: Performed at [facility], [address] [place], [place] [address]  hCG, quantitative, pregnancy     Status: None   Collection Time: 08/07/24  6:34 AM  Result Value Ref Range   hCG, Beta Chain, Quant, S <1 <5 mIU/mL    Comment:          GEST. AGE      CONC.  (mIU/mL)   <=1 WEEK        5 - 50     2 WEEKS       50 - 500     3 WEEKS       100 - 10,000     4 WEEKS     1,000 - 30,000  5 WEEKS     3,500 - 115,000   6-8 WEEKS     12,000 - 270,000    12 WEEKS     15,000 - 220,000        FEMALE AND NON-PREGNANT FEMALE:     LESS THAN 5 mIU/mL Performed at [facility], [address] [place], [place] [address]    Basic metabolic panel     Status: Abnormal   Collection Time: 08/07/24  6:34 AM  Result Value Ref Range   Sodium 137 135 - 145 mmol/L   Potassium 3.9 3.5 - 5.1 mmol/L   Chloride 99 98 - 111 mmol/L   CO2 20 (L) 22 - 32 mmol/L   Glucose, Bld 186 (H) 70 - 99 mg/dL    Comment: Glucose reference range applies only to samples taken after fasting for at least 8 hours.   BUN 15 6 - 20 mg/dL   Creatinine, Ser 9.20 0.44 - 1.00 mg/dL   Calcium  9.1 8.9 - 10.3 mg/dL   GFR, Estimated >39 >39 mL/min    Comment: (NOTE) Calculated using the CKD-EPI Creatinine Equation (2021)    Anion gap 18 (H) 5 - 15    Comment: Performed at [facility], [address] [place], [place] [address]  Lipase, blood     Status: None   Collection Time: 08/08/24  3:17 AM  Result Value Ref Range   Lipase 17 11 - 51 U/L    Comment: Performed at [facility], [address] [place], [place] [address]  CBC with Differential     Status: Abnormal   Collection Time: 08/08/24  3:17 AM  Result Value Ref Range   WBC 18.9 (H) 4.0 - 10.5 K/uL   RBC 4.03 3.87 - 5.11 MIL/uL   Hemoglobin 12.3 12.0 - 15.0 g/dL   HCT 63.5 63.9 - 53.9 %   MCV 90.3 80.0 - 100.0 fL   MCH 30.5 26.0 - 34.0 pg   MCHC 33.8 30.0 - 36.0 g/dL   RDW 86.9 88.4 - 84.4 %   Platelets 339 150 - 400 K/uL   nRBC 0.0 0.0 - 0.2 %   Neutrophils Relative % 82 %   Neutro Abs 15.5 (H) 1.7 - 7.7 K/uL   Lymphocytes Relative 8 %   Lymphs Abs 1.6 0.7 - 4.0 K/uL   Monocytes Relative 9 %   Monocytes Absolute 1.7 (H) 0.1 - 1.0 K/uL   Eosinophils Relative 0 %   Eosinophils Absolute 0.0 0.0 - 0.5 K/uL   Basophils Relative 0 %   Basophils Absolute 0.1 0.0 - 0.1 K/uL   Immature Granulocytes 1 %   Abs Immature Granulocytes 0.11 (H) 0.00 - 0.07 K/uL    Comment: Performed at [facility], [address] [place], [place] [address]  Comprehensive metabolic panel     Status: Abnormal   Collection Time: 08/08/24  3:17 AM  Result Value Ref Range    Sodium 137 135 - 145 mmol/L   Potassium 3.6 3.5 - 5.1 mmol/L   Chloride 97 (L) 98 - 111 mmol/L   CO2 25 22 - 32 mmol/L   Glucose, Bld 177 (H) 70 - 99 mg/dL    Comment: Glucose reference range applies only to samples taken after fasting for at least 8 hours.   BUN 22 (H) 6 - 20 mg/dL   Creatinine, Ser 9.15 0.44 - 1.00 mg/dL   Calcium  9.2 8.9 - 10.3 mg/dL   Total Protein 7.6 6.5 -  8.1 g/dL   Albumin 4.1 3.5 - 5.0 g/dL   AST 13 (L) 15 - 41 U/L   ALT 13 0 - 44 U/L   Alkaline Phosphatase 55 38 - 126 U/L   Total Bilirubin 0.4 0.0 - 1.2 mg/dL   GFR, Estimated >39 >39 mL/min    Comment: (NOTE) Calculated using the CKD-EPI Creatinine Equation (2021)    Anion gap 15 5 - 15    Comment: Performed at [facility], [address] [place], [place] [address]  Magnesium      Status: Abnormal   Collection Time: 08/08/24  3:17 AM  Result Value Ref Range   Magnesium  2.5 (H) 1.7 - 2.4 mg/dL    Comment: Performed at [facility], [address] [place], [place] [address]  Comprehensive metabolic panel     Status: Abnormal   Collection Time: 08/09/24  3:46 AM  Result Value Ref Range   Sodium 136 135 - 145 mmol/L   Potassium 3.9 3.5 - 5.1 mmol/L    Comment: HEMOLYSIS AT THIS LEVEL MAY AFFECT RESULT   Chloride 94 (L) 98 - 111 mmol/L   CO2 24 22 - 32 mmol/L   Glucose, Bld 162 (H) 70 - 99 mg/dL    Comment: Glucose reference range applies only to samples taken after fasting for at least 8 hours.   BUN 22 (H) 6 - 20 mg/dL   Creatinine, Ser 9.28 0.44 - 1.00 mg/dL   Calcium  9.2 8.9 - 10.3 mg/dL   Total Protein 7.8 6.5 - 8.1 g/dL   Albumin 4.0 3.5 - 5.0 g/dL   AST 19 15 - 41 U/L    Comment: HEMOLYSIS AT THIS LEVEL MAY AFFECT RESULT   ALT 11 0 - 44 U/L   Alkaline Phosphatase 56 38 - 126 U/L   Total Bilirubin 0.6 0.0 - 1.2 mg/dL   GFR, Estimated >39 >39 mL/min    Comment: (NOTE) Calculated using the CKD-EPI Creatinine Equation (2021)    Anion gap 17 (H) 5 - 15    Comment:  Performed at [facility], [address] [place], [place] [address]  CBC with Differential     Status: Abnormal   Collection Time: 08/09/24  3:46 AM  Result Value Ref Range   WBC 12.9 (H) 4.0 - 10.5 K/uL   RBC 4.49 3.87 - 5.11 MIL/uL   Hemoglobin 13.7 12.0 - 15.0 g/dL   HCT 59.3 63.9 - 53.9 %   MCV 90.4 80.0 - 100.0 fL   MCH 30.5 26.0 - 34.0 pg   MCHC 33.7 30.0 - 36.0 g/dL   RDW 87.4 88.4 - 84.4 %   Platelets 362 150 - 400 K/uL   nRBC 0.0 0.0 - 0.2 %   Neutrophils Relative % 71 %   Neutro Abs 9.2 (H) 1.7 - 7.7 K/uL   Lymphocytes Relative 19 %   Lymphs Abs 2.5 0.7 - 4.0 K/uL   Monocytes Relative 9 %   Monocytes Absolute 1.1 (H) 0.1 - 1.0 K/uL   Eosinophils Relative 0 %   Eosinophils Absolute 0.0 0.0 - 0.5 K/uL   Basophils Relative 0 %   Basophils Absolute 0.0 0.0 - 0.1 K/uL   Immature Granulocytes 1 %   Abs Immature Granulocytes 0.07 0.00 - 0.07 K/uL    Comment: Performed at [facility], [address] [place], [place] [address]  Lipase, blood     Status: None   Collection Time: 08/09/24  3:46 AM  Result Value Ref  Range   Lipase 18 11 - 51 U/L    Comment: Performed at [facility], [address] [place], [place] [address]  Magnesium      Status: Abnormal   Collection Time: 08/09/24  3:46 AM  Result Value Ref Range   Magnesium  2.5 (H) 1.7 - 2.4 mg/dL    Comment: Performed at [facility], [address], [place], [place] [address]      Assessment & Plan:  RUQ ultrasound in 6  months Phenergan  suppository Carafate Bland diet Sips of water and electrolyte drink  Problem List Items Addressed This Visit       Digestive   Gallbladder disease - Primary   Relevant Orders   US  ABDOMEN LIMITED RUQ (LIVER/GB)   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   Total time spent: 25 minutes. This time includes review of previous notes and results and patient face to face interaction during today's visit.    Jeoffrey Pollen, NP  08/10/2024   This document may have been prepared by Dragon Voice Recognition software and as such may include unintentional dictation errors.

## 2024-08-11 ENCOUNTER — Other Ambulatory Visit: Payer: Self-pay

## 2024-08-11 MED ORDER — ONETOUCH VERIO VI STRP: ORAL_STRIP | 12 refills | Status: DC

## 2024-08-11 MED ORDER — ALCOHOL PREP 70 % PADS: MEDICATED_PAD | 3 refills | Status: AC

## 2024-08-11 MED ORDER — ONETOUCH DELICA PLUS LANCET33G MISC: 3 refills | Status: DC

## 2024-08-13 DIAGNOSIS — K829 Disease of gallbladder, unspecified: Secondary | ICD-10-CM | POA: Insufficient documentation

## 2024-08-13 DIAGNOSIS — R1115 Cyclical vomiting syndrome unrelated to migraine: Secondary | ICD-10-CM | POA: Insufficient documentation

## 2024-08-13 DIAGNOSIS — R1013 Epigastric pain: Secondary | ICD-10-CM | POA: Insufficient documentation

## 2024-08-14 ENCOUNTER — Ambulatory Visit (INDEPENDENT_AMBULATORY_CARE_PROVIDER_SITE_OTHER): Admitting: Cardiology

## 2024-08-14 ENCOUNTER — Encounter: Payer: Self-pay | Admitting: Cardiology

## 2024-08-14 ENCOUNTER — Ambulatory Visit: Admitting: Cardiology

## 2024-08-14 VITALS — BP 120/74 | HR 108 | Ht 65.0 in | Wt 149.0 lb

## 2024-08-14 DIAGNOSIS — Z0130 Encounter for examination of blood pressure without abnormal findings: Secondary | ICD-10-CM

## 2024-08-14 DIAGNOSIS — R1115 Cyclical vomiting syndrome unrelated to migraine: Secondary | ICD-10-CM | POA: Diagnosis not present

## 2024-08-14 DIAGNOSIS — R1013 Epigastric pain: Secondary | ICD-10-CM

## 2024-08-14 DIAGNOSIS — K829 Disease of gallbladder, unspecified: Secondary | ICD-10-CM | POA: Diagnosis not present

## 2024-08-14 DIAGNOSIS — E119 Type 2 diabetes mellitus without complications: Secondary | ICD-10-CM

## 2024-08-14 DIAGNOSIS — E1165 Type 2 diabetes mellitus with hyperglycemia: Secondary | ICD-10-CM | POA: Diagnosis not present

## 2024-08-14 MED ORDER — FAMOTIDINE 20 MG PO TABS: 20.0000 mg | ORAL_TABLET | Freq: Two times a day (BID) | ORAL | 1 refills | Status: DC

## 2024-08-14 MED ORDER — PANTOPRAZOLE SODIUM 20 MG PO TBEC: 20.0000 mg | DELAYED_RELEASE_TABLET | Freq: Every day | ORAL | 1 refills | Status: DC

## 2024-08-14 NOTE — Progress Notes (Signed)
 Established Patient Office Visit  Subjective:  Patient ID: [name], female    DOB: [birth year]  Age: 43 y.o. MRN: [id number]  Chief Complaint  Patient presents with   Follow-up    4 day follow up    Patient in office for 4 day follow up. Patient reports feeling somewhat better. No longer vomiting. Eating some, chicken broth, a few noodles. Blood sugars 147 this am, 167 yesterday morning. Not taking the Synjardy . No longer taking the Carafate . Patient reports continuous epigastric pain, states it feels like acid reflux. Continues to have some diarrhea. Has a headache today. Has been drinking small sips of water, electrolyte drink. Will do a BMP today to check kidney function.  Advance diet as tolerated.  Will send in pantoprazole  and Pepcid  AC for acid reflux.  Will send referral to GI for ongoing nausea, epigastric pain.  Patient understands if pain worsens, or vomiting returns, she should go to the ED.     No other concerns at this time.   Past Medical History:  Diagnosis Date   Anemia    Anxiety    Diabetes mellitus without complication (HCC)    last A1C 5.1   Heart murmur    was seen by cardiology, benign, no follow up needed   Onychomycosis    SIRS (systemic inflammatory response syndrome) (HCC) 07/09/2021    Past Surgical History:  Procedure Laterality Date   DILATATION & CURETTAGE/HYSTEROSCOPY WITH MYOSURE  08/29/2022   Procedure: HYSTEROSCOPY WITH MYOSURE RESECTION OF UTERINE FIBROIDS;  Surgeon: Rutherford Gain, MD;  Location: [facility];  Service: Gynecology;;   DILATATION & CURRETTAGE/HYSTEROSCOPY WITH RESECTOCOPE N/A 12/07/2022   Procedure: DILATATION & CURETTAGE/HYSTEROSCOPY WITH RESECTOCOPE;  Surgeon: Rutherford Gain, MD;  Location: [facility];  Service: Gynecology;  Laterality: N/A;   DILATION AND CURETTAGE OF UTERUS N/A 08/29/2022   Procedure: ULTRASOUND GUIDED  SUCTION DILATATION AND CURETTAGE,  EXCISION PROLASPED UTERINE FIBROID;   Surgeon: Rutherford Gain, MD;  Location: [facility];  Service: Gynecology;  Laterality: N/A;   NO PAST SURGERIES     OPERATIVE ULTRASOUND N/A 08/29/2022   Procedure: OPERATIVE ULTRASOUND;  Surgeon: Rutherford Gain, MD;  Location: [facility];  Service: Gynecology;  Laterality: N/A;    Social History   Socioeconomic History   Marital status: Significant Other    Spouse name: Not on file   Number of children: 1   Years of education: Not on file   Highest education level: Not on file  Occupational History   Not on file  Tobacco Use   Smoking status: Never   Smokeless tobacco: Never  Vaping Use   Vaping status: Never Used  Substance and Sexual Activity   Alcohol  use: Yes    Comment: occ   Drug use: Never   Sexual activity: Not Currently  Other Topics Concern   Not on file  Social History Narrative   Not on file   Social Drivers of Health   Financial Resource Strain: Not on file  Food Insecurity: Not on file  Transportation Needs: Not on file  Physical Activity: Not on file  Stress: Not on file  Social Connections: Not on file  Intimate Partner Violence: Not on file    Family History  Problem Relation Age of Onset   Hypertension Mother    Diabetes Mother    Diabetes Maternal Grandmother     No Known Allergies  Outpatient Medications Prior to Visit  Medication Sig   Alcohol  Swabs (ALCOHOL  PREP) 70 %  PADS Use with diabetic supplies prior to finger stick   atorvastatin  (LIPITOR) 20 MG tablet Take 1 tablet (20 mg total) by mouth daily.   BLACK CURRANT SEED OIL PO Take by mouth.   Elderberry 500 MG CAPS Take by mouth. (Patient taking differently: Take by mouth as needed.)   ferrous sulfate  325 (65 FE) MG tablet TAKE 1 TABLET BY MOUTH TWICE A DAY (Patient taking differently: Take 325 mg by mouth as needed.)   Fluocinolone Acetonide Body 0.01 % OIL Apply topically every other day.   glucose blood (ONETOUCH VERIO) test strip Use with the device to check sugars twice daily    ibuprofen  (ADVIL ) 800 MG tablet Take 1 tablet (800 mg total) by mouth every 8 (eight) hours as needed for mild pain or moderate pain.   Lancets (ONETOUCH DELICA PLUS LANCET33G) MISC Use with device to check sugars twice daily   magnesium  gluconate (MAGONATE) 500 MG tablet Take 500 mg by mouth daily.   NON FORMULARY soursop   Omega-3 Fatty Acids (FISH OIL PO) Take 2 tablets by mouth daily. 2 grams daily   ondansetron  (ZOFRAN ) 4 MG tablet Take 1 tablet (4 mg total) by mouth every 6 (six) hours as needed for up to 7 days for nausea or vomiting.   promethazine  (PHENERGAN ) 12.5 MG tablet Take 1 tablet (12.5 mg total) by mouth every 6 (six) hours as needed for up to 5 days for nausea or vomiting.   promethazine  (PHENERGAN ) 25 MG suppository Place 1 suppository (25 mg total) rectally every 6 (six) hours as needed for nausea or vomiting.   spironolactone (ALDACTONE) 50 MG tablet Take 50 mg by mouth daily.   SYNJARDY  XR 12.01-999 MG TB24 Take 1 tablet by mouth every morning.   traZODone  HCl 50 MG/5ML SOLN Take 5 mLs by mouth at bedtime as needed.   tretinoin (RETIN-A) 0.025 % cream Apply topically at bedtime.   triamcinolone  ointment (KENALOG ) 0.5 % Apply 1 Application topically 2 (two) times daily.   [DISCONTINUED] omeprazole  (PRILOSEC  OTC) 20 MG tablet Take 1 tablet (20 mg total) by mouth daily.   [DISCONTINUED] sucralfate  (CARAFATE ) 1 GM/10ML suspension Take 10 mLs (1 g total) by mouth 4 (four) times daily.   No facility-administered medications prior to visit.    Review of Systems  Constitutional: Negative.   HENT: Negative.    Eyes: Negative.   Respiratory: Negative.  Negative for shortness of breath.   Cardiovascular: Negative.  Negative for chest pain.  Gastrointestinal:  Positive for abdominal pain, diarrhea and heartburn. Negative for constipation.  Genitourinary: Negative.   Musculoskeletal:  Negative for joint pain and myalgias.  Skin: Negative.   Neurological:  Positive for headaches.  Negative for dizziness.  Endo/Heme/Allergies: Negative.   All other systems reviewed and are negative.      Objective:   BP 120/74   Pulse (!) 108   Ht 5' 5 (1.651 m)   Wt 149 lb (67.6 kg)   LMP 07/14/2024 (Approximate)   SpO2 97%   BMI 24.79 kg/m   Vitals:   08/14/24 1409  BP: 120/74  Pulse: (!) 108  Height: 5' 5 (1.651 m)  Weight: 149 lb (67.6 kg)  SpO2: 97%  BMI (Calculated): 24.79    Physical Exam Vitals and nursing note reviewed.  Constitutional:      Appearance: Normal appearance. She is normal weight.  HENT:     Head: Normocephalic and atraumatic.     Nose: Nose normal.     Mouth/Throat:  Mouth: Mucous membranes are moist.  Eyes:     Extraocular Movements: Extraocular movements intact.     Conjunctiva/sclera: Conjunctivae normal.     Pupils: Pupils are equal, round, and reactive to light.  Cardiovascular:     Rate and Rhythm: Normal rate and regular rhythm.     Pulses: Normal pulses.     Heart sounds: Normal heart sounds.  Pulmonary:     Effort: Pulmonary effort is normal.     Breath sounds: Normal breath sounds.  Abdominal:     General: Abdomen is flat. Bowel sounds are normal.     Palpations: Abdomen is soft.  Musculoskeletal:        General: Normal range of motion.     Cervical back: Normal range of motion.  Skin:    General: Skin is warm and dry.  Neurological:     General: No focal deficit present.     Mental Status: She is alert and oriented to person, place, and time.  Psychiatric:        Mood and Affect: Mood normal.        Behavior: Behavior normal.        Thought Content: Thought content normal.        Judgment: Judgment normal.      No results found for any visits on 08/14/24.  Recent Results (from the past 2160 hours)  Hepatic function panel     Status: None   Collection Time: 05/20/24  8:06 AM  Result Value Ref Range   Total Protein 7.8 6.0 - 8.5 g/dL   Albumin 4.7 3.9 - 4.9 g/dL   Bilirubin Total 0.4 0.0 - 1.2 mg/dL    Bilirubin, Direct 9.85 0.00 - 0.40 mg/dL   Alkaline Phosphatase 61 44 - 121 IU/L   AST 18 0 - 40 IU/L   ALT 28 0 - 32 IU/L  CMP14+EGFR     Status: Abnormal   Collection Time: 08/03/24  8:21 AM  Result Value Ref Range   Glucose 144 (H) 70 - 99 mg/dL   BUN 22 6 - 24 mg/dL   Creatinine, Ser 9.05 0.57 - 1.00 mg/dL   eGFR 77 >40 fO/fpw/8.26   BUN/Creatinine Ratio 23 9 - 23   Sodium 138 134 - 144 mmol/L   Potassium 4.4 3.5 - 5.2 mmol/L   Chloride 101 96 - 106 mmol/L   CO2 22 20 - 29 mmol/L   Calcium  10.2 8.7 - 10.2 mg/dL   Total Protein 7.7 6.0 - 8.5 g/dL   Albumin 4.7 3.9 - 4.9 g/dL   Globulin, Total 3.0 1.5 - 4.5 g/dL   Bilirubin Total 0.4 0.0 - 1.2 mg/dL   Alkaline Phosphatase 62 41 - 116 IU/L   AST 12 0 - 40 IU/L   ALT 20 0 - 32 IU/L  Hemoglobin A1c     Status: Abnormal   Collection Time: 08/03/24  8:21 AM  Result Value Ref Range   Hgb A1c MFr Bld 7.2 (H) 4.8 - 5.6 %    Comment:          Prediabetes: 5.7 - 6.4          Diabetes: >6.4          Glycemic control for adults with diabetes: <7.0    Est. average glucose Bld gHb Est-mCnc 160 mg/dL  Lipid Profile     Status: Abnormal   Collection Time: 08/03/24  8:21 AM  Result Value Ref Range   Cholesterol, Total 188 100 -  199 mg/dL   Triglycerides 896 0 - 149 mg/dL   HDL 59 >60 mg/dL   VLDL Cholesterol Cal 18 5 - 40 mg/dL   LDL Chol Calc (NIH) 888 (H) 0 - 99 mg/dL   Chol/HDL Ratio 3.2 0.0 - 4.4 ratio    Comment:                                   T. Chol/HDL Ratio                                             Men  Women                               1/2 Avg.Risk  3.4    3.3                                   Avg.Risk  5.0    4.4                                2X Avg.Risk  9.6    7.1                                3X Avg.Risk 23.4   11.0   TSH     Status: None   Collection Time: 08/03/24  8:21 AM  Result Value Ref Range   TSH 1.250 0.450 - 4.500 uIU/mL  Lipase, blood     Status: None   Collection Time: 08/06/24  5:35 PM   Result Value Ref Range   Lipase 15 11 - 51 U/L    Comment: Performed at [facility], [address] [place], [place] [address]  Comprehensive metabolic panel     Status: Abnormal   Collection Time: 08/06/24  5:35 PM  Result Value Ref Range   Sodium 139 135 - 145 mmol/L   Potassium 3.9 3.5 - 5.1 mmol/L   Chloride 100 98 - 111 mmol/L   CO2 19 (L) 22 - 32 mmol/L   Glucose, Bld 189 (H) 70 - 99 mg/dL    Comment: Glucose reference range applies only to samples taken after fasting for at least 8 hours.   BUN 13 6 - 20 mg/dL   Creatinine, Ser 9.17 0.44 - 1.00 mg/dL   Calcium  9.5 8.9 - 10.3 mg/dL   Total Protein 8.3 (H) 6.5 - 8.1 g/dL   Albumin 4.5 3.5 - 5.0 g/dL   AST 15 15 - 41 U/L   ALT 16 0 - 44 U/L   Alkaline Phosphatase 63 38 - 126 U/L   Total Bilirubin 0.5 0.0 - 1.2 mg/dL   GFR, Estimated >39 >39 mL/min    Comment: (NOTE) Calculated using the CKD-EPI Creatinine Equation (2021)    Anion gap 20 (H) 5 - 15    Comment: Performed at [facility], [address] [place], [place] [address]  CBC     Status: Abnormal   Collection Time: 08/06/24  5:35 PM  Result Value Ref Range   WBC 17.0 (H)  4.0 - 10.5 K/uL   RBC 4.28 3.87 - 5.11 MIL/uL   Hemoglobin 13.1 12.0 - 15.0 g/dL   HCT 60.5 63.9 - 53.9 %   MCV 92.1 80.0 - 100.0 fL   MCH 30.6 26.0 - 34.0 pg   MCHC 33.2 30.0 - 36.0 g/dL   RDW 86.7 88.4 - 84.4 %   Platelets 321 150 - 400 K/uL   nRBC 0.0 0.0 - 0.2 %    Comment: Performed at [facility], [address] [place], [place] [address]  CBG monitoring, ED     Status: Abnormal   Collection Time: 08/07/24  4:00 AM  Result Value Ref Range   Glucose-Capillary 183 (H) 70 - 99 mg/dL    Comment: Glucose reference range applies only to samples taken after fasting for at least 8 hours.  Lipase, blood     Status: None   Collection Time: 08/07/24  4:02 AM  Result Value Ref Range   Lipase 14 11 - 51 U/L    Comment: Performed at [facility], [address] [place], [place] [address]  Comprehensive metabolic panel     Status: Abnormal   Collection Time: 08/07/24  4:02 AM  Result Value Ref Range   Sodium 138 135 - 145 mmol/L    Comment: Electrolytes repeated to verify    Potassium 4.0 3.5 - 5.1 mmol/L   Chloride 97 (L) 98 - 111 mmol/L   CO2 19 (L) 22 - 32 mmol/L   Glucose, Bld 202 (H) 70 - 99 mg/dL    Comment: Glucose reference range applies only to samples taken after fasting for at least 8 hours.   BUN 16 6 - 20 mg/dL   Creatinine, Ser 9.14 0.44 - 1.00 mg/dL   Calcium  9.8 8.9 - 10.3 mg/dL   Total Protein 8.5 (H) 6.5 - 8.1 g/dL   Albumin 4.6 3.5 - 5.0 g/dL   AST 16 15 - 41 U/L   ALT 16 0 - 44 U/L   Alkaline Phosphatase 62 38 - 126 U/L   Total Bilirubin 0.4 0.0 - 1.2 mg/dL   GFR, Estimated >39 >39 mL/min    Comment: (NOTE) Calculated using the CKD-EPI Creatinine Equation (2021)    Anion gap 22 (H) 5 - 15    Comment: Performed at [facility], [address] [place], [place] [address]  CBC     Status: Abnormal   Collection Time: 08/07/24  4:02 AM  Result Value Ref Range   WBC 15.1 (H) 4.0 - 10.5 K/uL   RBC 4.26 3.87 - 5.11 MIL/uL   Hemoglobin 13.2 12.0 - 15.0 g/dL   HCT 61.0 63.9 - 53.9 %   MCV 91.3 80.0 - 100.0 fL   MCH 31.0 26.0 - 34.0 pg   MCHC 33.9 30.0 - 36.0 g/dL   RDW 86.7 88.4 - 84.4 %   Platelets 336 150 - 400 K/uL   nRBC 0.0 0.0 - 0.2 %    Comment: Performed at [facility], [address] [place], [place] [address]  hCG, quantitative, pregnancy     Status: None   Collection Time: 08/07/24  6:34 AM  Result Value Ref Range   hCG, Beta Chain, Quant, S <1 <5 mIU/mL    Comment:          GEST. AGE      CONC.  (mIU/mL)   <=1 WEEK        5 - 50     2  WEEKS       50 - 500     3 WEEKS       100 - 10,000     4 WEEKS     1,000 - 30,000     5 WEEKS     3,500 - 115,000   6-8 WEEKS     12,000 - 270,000    12 WEEKS     15,000 - 220,000        FEMALE AND NON-PREGNANT FEMALE:     LESS THAN 5  mIU/mL Performed at [facility], [address] [place], [place] [address]   Basic metabolic panel     Status: Abnormal   Collection Time: 08/07/24  6:34 AM  Result Value Ref Range   Sodium 137 135 - 145 mmol/L   Potassium 3.9 3.5 - 5.1 mmol/L   Chloride 99 98 - 111 mmol/L   CO2 20 (L) 22 - 32 mmol/L   Glucose, Bld 186 (H) 70 - 99 mg/dL    Comment: Glucose reference range applies only to samples taken after fasting for at least 8 hours.   BUN 15 6 - 20 mg/dL   Creatinine, Ser 9.20 0.44 - 1.00 mg/dL   Calcium  9.1 8.9 - 10.3 mg/dL   GFR, Estimated >39 >39 mL/min    Comment: (NOTE) Calculated using the CKD-EPI Creatinine Equation (2021)    Anion gap 18 (H) 5 - 15    Comment: Performed at [facility], [address] [place], [place] [address]  Lipase, blood     Status: None   Collection Time: 08/08/24  3:17 AM  Result Value Ref Range   Lipase 17 11 - 51 U/L    Comment: Performed at [facility], [address] [place], [place] [address]  CBC with Differential     Status: Abnormal   Collection Time: 08/08/24  3:17 AM  Result Value Ref Range   WBC 18.9 (H) 4.0 - 10.5 K/uL   RBC 4.03 3.87 - 5.11 MIL/uL   Hemoglobin 12.3 12.0 - 15.0 g/dL   HCT 63.5 63.9 - 53.9 %   MCV 90.3 80.0 - 100.0 fL   MCH 30.5 26.0 - 34.0 pg   MCHC 33.8 30.0 - 36.0 g/dL   RDW 86.9 88.4 - 84.4 %   Platelets 339 150 - 400 K/uL   nRBC 0.0 0.0 - 0.2 %   Neutrophils Relative % 82 %   Neutro Abs 15.5 (H) 1.7 - 7.7 K/uL   Lymphocytes Relative 8 %   Lymphs Abs 1.6 0.7 - 4.0 K/uL   Monocytes Relative 9 %   Monocytes Absolute 1.7 (H) 0.1 - 1.0 K/uL   Eosinophils Relative 0 %   Eosinophils Absolute 0.0 0.0 - 0.5 K/uL   Basophils Relative 0 %   Basophils Absolute 0.1 0.0 - 0.1 K/uL   Immature Granulocytes 1 %   Abs Immature Granulocytes 0.11 (H) 0.00 - 0.07 K/uL    Comment: Performed at [facility], [address] [place], [place] [address]  Comprehensive metabolic  panel     Status: Abnormal   Collection Time: 08/08/24  3:17 AM  Result Value Ref Range   Sodium 137 135 - 145 mmol/L   Potassium 3.6 3.5 - 5.1 mmol/L   Chloride 97 (L) 98 - 111 mmol/L   CO2 25 22 - 32 mmol/L   Glucose, Bld 177 (H) 70 - 99 mg/dL    Comment: Glucose reference range applies only to  samples taken after fasting for at least 8 hours.   BUN 22 (H) 6 - 20 mg/dL   Creatinine, Ser 9.15 0.44 - 1.00 mg/dL   Calcium  9.2 8.9 - 10.3 mg/dL   Total Protein 7.6 6.5 - 8.1 g/dL   Albumin 4.1 3.5 - 5.0 g/dL   AST 13 (L) 15 - 41 U/L   ALT 13 0 - 44 U/L   Alkaline Phosphatase 55 38 - 126 U/L   Total Bilirubin 0.4 0.0 - 1.2 mg/dL   GFR, Estimated >39 >39 mL/min    Comment: (NOTE) Calculated using the CKD-EPI Creatinine Equation (2021)    Anion gap 15 5 - 15    Comment: Performed at [facility], [address] [place], [place] [address]  Magnesium      Status: Abnormal   Collection Time: 08/08/24  3:17 AM  Result Value Ref Range   Magnesium  2.5 (H) 1.7 - 2.4 mg/dL    Comment: Performed at [facility], [address] [place], [place] [address]  Comprehensive metabolic panel     Status: Abnormal   Collection Time: 08/09/24  3:46 AM  Result Value Ref Range   Sodium 136 135 - 145 mmol/L   Potassium 3.9 3.5 - 5.1 mmol/L    Comment: HEMOLYSIS AT THIS LEVEL MAY AFFECT RESULT   Chloride 94 (L) 98 - 111 mmol/L   CO2 24 22 - 32 mmol/L   Glucose, Bld 162 (H) 70 - 99 mg/dL    Comment: Glucose reference range applies only to samples taken after fasting for at least 8 hours.   BUN 22 (H) 6 - 20 mg/dL   Creatinine, Ser 9.28 0.44 - 1.00 mg/dL   Calcium  9.2 8.9 - 10.3 mg/dL   Total Protein 7.8 6.5 - 8.1 g/dL   Albumin 4.0 3.5 - 5.0 g/dL   AST 19 15 - 41 U/L    Comment: HEMOLYSIS AT THIS LEVEL MAY AFFECT RESULT   ALT 11 0 - 44 U/L   Alkaline Phosphatase 56 38 - 126 U/L   Total Bilirubin 0.6 0.0 - 1.2 mg/dL   GFR, Estimated >39 >39 mL/min    Comment: (NOTE) Calculated  using the CKD-EPI Creatinine Equation (2021)    Anion gap 17 (H) 5 - 15    Comment: Performed at [facility], [address] [place], [place] [address]  CBC with Differential     Status: Abnormal   Collection Time: 08/09/24  3:46 AM  Result Value Ref Range   WBC 12.9 (H) 4.0 - 10.5 K/uL   RBC 4.49 3.87 - 5.11 MIL/uL   Hemoglobin 13.7 12.0 - 15.0 g/dL   HCT 59.3 63.9 - 53.9 %   MCV 90.4 80.0 - 100.0 fL   MCH 30.5 26.0 - 34.0 pg   MCHC 33.7 30.0 - 36.0 g/dL   RDW 87.4 88.4 - 84.4 %   Platelets 362 150 - 400 K/uL   nRBC 0.0 0.0 - 0.2 %   Neutrophils Relative % 71 %   Neutro Abs 9.2 (H) 1.7 - 7.7 K/uL   Lymphocytes Relative 19 %   Lymphs Abs 2.5 0.7 - 4.0 K/uL   Monocytes Relative 9 %   Monocytes Absolute 1.1 (H) 0.1 - 1.0 K/uL   Eosinophils Relative 0 %   Eosinophils Absolute 0.0 0.0 - 0.5 K/uL   Basophils Relative 0 %   Basophils Absolute 0.0 0.0 - 0.1 K/uL   Immature Granulocytes 1 %   Abs Immature Granulocytes  0.07 0.00 - 0.07 K/uL    Comment: Performed at [facility], [address] [place], [place] [address]  Lipase, blood     Status: None   Collection Time: 08/09/24  3:46 AM  Result Value Ref Range   Lipase 18 11 - 51 U/L    Comment: Performed at [facility], 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   Total time spent: 25 minutes. This time includes review of previous notes and results and patient face to face interaction during today's visit.    Jeoffrey Pollen, NP  08/14/2024   This document may have been prepared by Dragon Voice Recognition software and as such may include unintentional dictation errors.

## 2024-08-15 LAB — BASIC METABOLIC PANEL WITH GFR
BUN/Creatinine Ratio: 18 (ref 9–23)
BUN: 14 mg/dL (ref 6–24)
CO2: 26 mmol/L (ref 20–29)
Calcium: 9.8 mg/dL (ref 8.7–10.2)
Chloride: 90 mmol/L — ABNORMAL LOW (ref 96–106)
Creatinine, Ser: 0.77 mg/dL (ref 0.57–1.00)
Glucose: 139 mg/dL — ABNORMAL HIGH (ref 70–99)
Potassium: 3.1 mmol/L — ABNORMAL LOW (ref 3.5–5.2)
Sodium: 135 mmol/L (ref 134–144)
eGFR: 98 mL/min/1.73 (ref >59)

## 2024-08-17 ENCOUNTER — Ambulatory Visit: Payer: Self-pay | Admitting: Cardiology

## 2024-08-18 NOTE — Progress Notes (Signed)
Pt informed

## 2024-08-19 ENCOUNTER — Other Ambulatory Visit: Payer: Self-pay

## 2024-08-21 MED ORDER — SYNJARDY XR 12.5-1000 MG PO TB24: 1.0000 | ORAL_TABLET | Freq: Every morning | ORAL | 1 refills | Status: AC

## 2024-08-23 ENCOUNTER — Other Ambulatory Visit: Payer: Self-pay | Admitting: Cardiology

## 2024-08-24 ENCOUNTER — Ambulatory Visit: Admitting: Cardiology

## 2024-08-24 ENCOUNTER — Other Ambulatory Visit: Payer: Self-pay | Admitting: Cardiology

## 2024-08-28 ENCOUNTER — Ambulatory Visit: Admitting: Cardiology

## 2024-08-28 ENCOUNTER — Encounter: Payer: Self-pay | Admitting: Cardiology

## 2024-08-28 VITALS — BP 114/68 | HR 109 | Ht 65.0 in | Wt 149.0 lb

## 2024-08-28 DIAGNOSIS — K219 Gastro-esophageal reflux disease without esophagitis: Secondary | ICD-10-CM | POA: Diagnosis not present

## 2024-08-28 DIAGNOSIS — R110 Nausea: Secondary | ICD-10-CM

## 2024-08-28 DIAGNOSIS — Z0130 Encounter for examination of blood pressure without abnormal findings: Secondary | ICD-10-CM

## 2024-08-28 DIAGNOSIS — E119 Type 2 diabetes mellitus without complications: Secondary | ICD-10-CM

## 2024-08-28 DIAGNOSIS — E1165 Type 2 diabetes mellitus with hyperglycemia: Secondary | ICD-10-CM

## 2024-08-28 DIAGNOSIS — E782 Mixed hyperlipidemia: Secondary | ICD-10-CM

## 2024-08-28 MED ORDER — ACCU-CHEK SOFTCLIX LANCET DEV KIT: PACK | 0 refills | Status: AC

## 2024-08-28 MED ORDER — PANTOPRAZOLE SODIUM 40 MG PO TBEC: 40.0000 mg | DELAYED_RELEASE_TABLET | Freq: Every day | ORAL | 3 refills | Status: AC

## 2024-08-28 MED ORDER — ACCU-CHEK GUIDE TEST VI STRP: ORAL_STRIP | 12 refills | Status: AC

## 2024-08-28 MED ORDER — ACCU-CHEK GUIDE W/DEVICE KIT: PACK | 0 refills | Status: AC

## 2024-08-28 MED ORDER — PROMETHAZINE HCL 12.5 MG PO TABS: 12.5000 mg | ORAL_TABLET | Freq: Four times a day (QID) | ORAL | 1 refills | Status: AC | PRN

## 2024-08-28 NOTE — Progress Notes (Signed)
 Established Patient Office Visit  Subjective:  Patient ID: [name], female    DOB: 05/08/1981  Age: 43 y.o. MRN: [id number]  Chief Complaint  Patient presents with   Follow-up    1 week follow up, requesting a new meter and test strips pharmacy could not dispense the previous script due to insurance.    Patient in office for one week follow up. Patient feeling much better. No longer vomiting, no abdominal pain. Continues to have nausea and acid reflux. Taking pantoprazole , will increase to 40 mg daily. Will refill promethazine .  Patient needing a new glucometer, insurance will not cover current test strips. New glucometer sent in.  Continue current medications.     No other concerns at this time.   Past Medical History:  Diagnosis Date   Anemia    Anxiety    Diabetes mellitus without complication (HCC)    last A1C 5.1   Heart murmur    was seen by cardiology, benign, no follow up needed   Onychomycosis    SIRS (systemic inflammatory response syndrome) (HCC) 07/09/2021    Past Surgical History:  Procedure Laterality Date   DILATATION & CURETTAGE/HYSTEROSCOPY WITH MYOSURE  08/29/2022   Procedure: HYSTEROSCOPY WITH MYOSURE RESECTION OF UTERINE FIBROIDS;  Surgeon: Rutherford Gain, MD;  Location: [facility];  Service: Gynecology;;   DILATATION & CURRETTAGE/HYSTEROSCOPY WITH RESECTOCOPE N/A 12/07/2022   Procedure: DILATATION & CURETTAGE/HYSTEROSCOPY WITH RESECTOCOPE;  Surgeon: Rutherford Gain, MD;  Location: [facility];  Service: Gynecology;  Laterality: N/A;   DILATION AND CURETTAGE OF UTERUS N/A 08/29/2022   Procedure: ULTRASOUND GUIDED  SUCTION DILATATION AND CURETTAGE,  EXCISION PROLASPED UTERINE FIBROID;  Surgeon: Rutherford Gain, MD;  Location: [facility];  Service: Gynecology;  Laterality: N/A;   NO PAST SURGERIES     OPERATIVE ULTRASOUND N/A 08/29/2022   Procedure: OPERATIVE ULTRASOUND;  Surgeon: Rutherford Gain, MD;  Location: [facility];  Service:  Gynecology;  Laterality: N/A;    Social History   Socioeconomic History   Marital status: Significant Other    Spouse name: Not on file   Number of children: 1   Years of education: Not on file   Highest education level: Not on file  Occupational History   Not on file  Tobacco Use   Smoking status: Never   Smokeless tobacco: Never  Vaping Use   Vaping status: Never Used  Substance and Sexual Activity   Alcohol  use: Yes    Comment: occ   Drug use: Never   Sexual activity: Not Currently  Other Topics Concern   Not on file  Social History Narrative   Not on file   Social Drivers of Health   Financial Resource Strain: Not on file  Food Insecurity: Not on file  Transportation Needs: Not on file  Physical Activity: Not on file  Stress: Not on file  Social Connections: Not on file  Intimate Partner Violence: Not on file    Family History  Problem Relation Age of Onset   Hypertension Mother    Diabetes Mother    Diabetes Maternal Grandmother     No Known Allergies  Outpatient Medications Prior to Visit  Medication Sig   Alcohol  Swabs (ALCOHOL  PREP) 70 % PADS Use with diabetic supplies prior to finger stick   atorvastatin  (LIPITOR) 20 MG tablet Take 1 tablet (20 mg total) by mouth daily.   BLACK CURRANT SEED OIL PO Take by mouth.   Elderberry 500 MG CAPS Take by mouth. (Patient taking  differently: Take by mouth as needed.)   magnesium  gluconate (MAGONATE) 500 MG tablet Take 500 mg by mouth daily.   Omega-3 Fatty Acids (FISH OIL PO) Take 2 tablets by mouth daily. 2 grams daily   spironolactone (ALDACTONE) 50 MG tablet Take 50 mg by mouth daily.   SYNJARDY  XR 12.01-999 MG TB24 Take 1 tablet by mouth every morning.   [DISCONTINUED] ferrous sulfate  325 (65 FE) MG tablet TAKE 1 TABLET BY MOUTH TWICE A DAY (Patient taking differently: Take 325 mg by mouth as needed.)   [DISCONTINUED] pantoprazole  (PROTONIX ) 20 MG tablet Take 1 tablet (20 mg total) by mouth daily.    [DISCONTINUED] famotidine  (PEPCID ) 20 MG tablet Take 1 tablet (20 mg total) by mouth 2 (two) times daily. (Patient not taking: Reported on 08/28/2024)   [DISCONTINUED] Fluocinolone Acetonide Body 0.01 % OIL Apply topically every other day. (Patient not taking: Reported on 08/28/2024)   [DISCONTINUED] glucose blood (ONETOUCH VERIO) test strip Use with the device to check sugars twice daily (Patient not taking: Reported on 08/28/2024)   [DISCONTINUED] ibuprofen  (ADVIL ) 800 MG tablet Take 1 tablet (800 mg total) by mouth every 8 (eight) hours as needed for mild pain or moderate pain. (Patient not taking: Reported on 08/28/2024)   [DISCONTINUED] Lancets (ONETOUCH DELICA PLUS LANCET33G) MISC Use with device to check sugars twice daily (Patient not taking: Reported on 08/28/2024)   [DISCONTINUED] NON FORMULARY soursop (Patient not taking: Reported on 08/28/2024)   [DISCONTINUED] promethazine  (PHENERGAN ) 12.5 MG tablet Take 1 tablet (12.5 mg total) by mouth every 6 (six) hours as needed for up to 5 days for nausea or vomiting. (Patient not taking: Reported on 08/28/2024)   [DISCONTINUED] promethazine  (PHENERGAN ) 25 MG suppository Place 1 suppository (25 mg total) rectally every 6 (six) hours as needed for nausea or vomiting. (Patient not taking: Reported on 08/28/2024)   [DISCONTINUED] traZODone  HCl 50 MG/5ML SOLN Take 5 mLs by mouth at bedtime as needed. (Patient not taking: Reported on 08/28/2024)   [DISCONTINUED] tretinoin (RETIN-A) 0.025 % cream Apply topically at bedtime. (Patient not taking: Reported on 08/28/2024)   [DISCONTINUED] triamcinolone  ointment (KENALOG ) 0.5 % Apply 1 Application topically 2 (two) times daily. (Patient not taking: Reported on 08/28/2024)   No facility-administered medications prior to visit.    Review of Systems  Constitutional: Negative.   HENT: Negative.    Eyes: Negative.   Respiratory: Negative.  Negative for shortness of breath.   Cardiovascular: Negative.  Negative for chest  pain.  Gastrointestinal:  Positive for heartburn and nausea. Negative for abdominal pain, constipation, diarrhea and vomiting.  Genitourinary: Negative.   Musculoskeletal:  Negative for joint pain and myalgias.  Skin: Negative.   Neurological: Negative.  Negative for dizziness and headaches.  Endo/Heme/Allergies: Negative.   All other systems reviewed and are negative.      Objective:   BP 114/68   Pulse (!) 109   Ht 5' 5 (1.651 m)   Wt 149 lb (67.6 kg)   LMP 07/14/2024 (Approximate)   SpO2 98%   BMI 24.79 kg/m   Vitals:   08/28/24 1007  BP: 114/68  Pulse: (!) 109  Height: 5' 5 (1.651 m)  Weight: 149 lb (67.6 kg)  SpO2: 98%  BMI (Calculated): 24.79    Physical Exam Vitals and nursing note reviewed.  Constitutional:      Appearance: Normal appearance. She is normal weight.  HENT:     Head: Normocephalic and atraumatic.     Nose: Nose normal.  Mouth/Throat:     Mouth: Mucous membranes are moist.  Eyes:     Extraocular Movements: Extraocular movements intact.     Conjunctiva/sclera: Conjunctivae normal.     Pupils: Pupils are equal, round, and reactive to light.  Cardiovascular:     Rate and Rhythm: Normal rate and regular rhythm.     Pulses: Normal pulses.     Heart sounds: Normal heart sounds.  Pulmonary:     Effort: Pulmonary effort is normal.     Breath sounds: Normal breath sounds.  Abdominal:     General: Abdomen is flat. Bowel sounds are normal.     Palpations: Abdomen is soft.  Musculoskeletal:        General: Normal range of motion.     Cervical back: Normal range of motion.  Skin:    General: Skin is warm and dry.  Neurological:     General: No focal deficit present.     Mental Status: She is alert and oriented to person, place, and time.  Psychiatric:        Mood and Affect: Mood normal.        Behavior: Behavior normal.        Thought Content: Thought content normal.        Judgment: Judgment normal.      No results found for any  visits on 08/28/24.  Recent Results (from the past 2160 hours)  CMP14+EGFR     Status: Abnormal   Collection Time: 08/03/24  8:21 AM  Result Value Ref Range   Glucose 144 (H) 70 - 99 mg/dL   BUN 22 6 - 24 mg/dL   Creatinine, Ser 9.05 0.57 - 1.00 mg/dL   eGFR 77 >40 fO/fpw/8.26   BUN/Creatinine Ratio 23 9 - 23   Sodium 138 134 - 144 mmol/L   Potassium 4.4 3.5 - 5.2 mmol/L   Chloride 101 96 - 106 mmol/L   CO2 22 20 - 29 mmol/L   Calcium  10.2 8.7 - 10.2 mg/dL   Total Protein 7.7 6.0 - 8.5 g/dL   Albumin 4.7 3.9 - 4.9 g/dL   Globulin, Total 3.0 1.5 - 4.5 g/dL   Bilirubin Total 0.4 0.0 - 1.2 mg/dL   Alkaline Phosphatase 62 41 - 116 IU/L   AST 12 0 - 40 IU/L   ALT 20 0 - 32 IU/L  Hemoglobin A1c     Status: Abnormal   Collection Time: 08/03/24  8:21 AM  Result Value Ref Range   Hgb A1c MFr Bld 7.2 (H) 4.8 - 5.6 %    Comment:          Prediabetes: 5.7 - 6.4          Diabetes: >6.4          Glycemic control for adults with diabetes: <7.0    Est. average glucose Bld gHb Est-mCnc 160 mg/dL  Lipid Profile     Status: Abnormal   Collection Time: 08/03/24  8:21 AM  Result Value Ref Range   Cholesterol, Total 188 100 - 199 mg/dL   Triglycerides 896 0 - 149 mg/dL   HDL 59 >60 mg/dL   VLDL Cholesterol Cal 18 5 - 40 mg/dL   LDL Chol Calc (NIH) 888 (H) 0 - 99 mg/dL   Chol/HDL Ratio 3.2 0.0 - 4.4 ratio    Comment:  T. Chol/HDL Ratio                                             Men  Women                               1/2 Avg.Risk  3.4    3.3                                   Avg.Risk  5.0    4.4                                2X Avg.Risk  9.6    7.1                                3X Avg.Risk 23.4   11.0   TSH     Status: None   Collection Time: 08/03/24  8:21 AM  Result Value Ref Range   TSH 1.250 0.450 - 4.500 uIU/mL  Lipase, blood     Status: None   Collection Time: 08/06/24  5:35 PM  Result Value Ref Range   Lipase 15 11 - 51 U/L    Comment:  Performed at [facility], [address] [place], [place] [address]  Comprehensive metabolic panel     Status: Abnormal   Collection Time: 08/06/24  5:35 PM  Result Value Ref Range   Sodium 139 135 - 145 mmol/L   Potassium 3.9 3.5 - 5.1 mmol/L   Chloride 100 98 - 111 mmol/L   CO2 19 (L) 22 - 32 mmol/L   Glucose, Bld 189 (H) 70 - 99 mg/dL    Comment: Glucose reference range applies only to samples taken after fasting for at least 8 hours.   BUN 13 6 - 20 mg/dL   Creatinine, Ser 9.17 0.44 - 1.00 mg/dL   Calcium  9.5 8.9 - 10.3 mg/dL   Total Protein 8.3 (H) 6.5 - 8.1 g/dL   Albumin 4.5 3.5 - 5.0 g/dL   AST 15 15 - 41 U/L   ALT 16 0 - 44 U/L   Alkaline Phosphatase 63 38 - 126 U/L   Total Bilirubin 0.5 0.0 - 1.2 mg/dL   GFR, Estimated >39 >39 mL/min    Comment: (NOTE) Calculated using the CKD-EPI Creatinine Equation (2021)    Anion gap 20 (H) 5 - 15    Comment: Performed at [facility], [address] [place], [place] [address]  CBC     Status: Abnormal   Collection Time: 08/06/24  5:35 PM  Result Value Ref Range   WBC 17.0 (H) 4.0 - 10.5 K/uL   RBC 4.28 3.87 - 5.11 MIL/uL   Hemoglobin 13.1 12.0 - 15.0 g/dL   HCT 60.5 63.9 - 53.9 %   MCV 92.1 80.0 - 100.0 fL   MCH 30.6 26.0 - 34.0 pg   MCHC 33.2 30.0 - 36.0 g/dL   RDW 86.7 88.4 - 84.4 %   Platelets 321 150 - 400 K/uL   nRBC 0.0 0.0 - 0.2 %    Comment: Performed at [facility] [place] [facility],  [address], [place], [place] [address]  CBG monitoring, ED     Status: Abnormal   Collection Time: 08/07/24  4:00 AM  Result Value Ref Range   Glucose-Capillary 183 (H) 70 - 99 mg/dL    Comment: Glucose reference range applies only to samples taken after fasting for at least 8 hours.  Lipase, blood     Status: None   Collection Time: 08/07/24  4:02 AM  Result Value Ref Range   Lipase 14 11 - 51 U/L    Comment: Performed at [facility], [address] [place], [place] [address]  Comprehensive metabolic  panel     Status: Abnormal   Collection Time: 08/07/24  4:02 AM  Result Value Ref Range   Sodium 138 135 - 145 mmol/L    Comment: Electrolytes repeated to verify    Potassium 4.0 3.5 - 5.1 mmol/L   Chloride 97 (L) 98 - 111 mmol/L   CO2 19 (L) 22 - 32 mmol/L   Glucose, Bld 202 (H) 70 - 99 mg/dL    Comment: Glucose reference range applies only to samples taken after fasting for at least 8 hours.   BUN 16 6 - 20 mg/dL   Creatinine, Ser 9.14 0.44 - 1.00 mg/dL   Calcium  9.8 8.9 - 10.3 mg/dL   Total Protein 8.5 (H) 6.5 - 8.1 g/dL   Albumin 4.6 3.5 - 5.0 g/dL   AST 16 15 - 41 U/L   ALT 16 0 - 44 U/L   Alkaline Phosphatase 62 38 - 126 U/L   Total Bilirubin 0.4 0.0 - 1.2 mg/dL   GFR, Estimated >39 >39 mL/min    Comment: (NOTE) Calculated using the CKD-EPI Creatinine Equation (2021)    Anion gap 22 (H) 5 - 15    Comment: Performed at [facility], [address] [place], [place] [address]  CBC     Status: Abnormal   Collection Time: 08/07/24  4:02 AM  Result Value Ref Range   WBC 15.1 (H) 4.0 - 10.5 K/uL   RBC 4.26 3.87 - 5.11 MIL/uL   Hemoglobin 13.2 12.0 - 15.0 g/dL   HCT 61.0 63.9 - 53.9 %   MCV 91.3 80.0 - 100.0 fL   MCH 31.0 26.0 - 34.0 pg   MCHC 33.9 30.0 - 36.0 g/dL   RDW 86.7 88.4 - 84.4 %   Platelets 336 150 - 400 K/uL   nRBC 0.0 0.0 - 0.2 %    Comment: Performed at [facility], [address] [place], [place] [address]  hCG, quantitative, pregnancy     Status: None   Collection Time: 08/07/24  6:34 AM  Result Value Ref Range   hCG, Beta Chain, Quant, S <1 <5 mIU/mL    Comment:          GEST. AGE      CONC.  (mIU/mL)   <=1 WEEK        5 - 50     2 WEEKS       50 - 500     3 WEEKS       100 - 10,000     4 WEEKS     1,000 - 30,000     5 WEEKS     3,500 - 115,000   6-8 WEEKS     12,000 - 270,000    12 WEEKS     15,000 - 220,000        FEMALE AND NON-PREGNANT FEMALE:  LESS THAN 5 mIU/mL Performed at [facility], [address] [place], [place] [address]   Basic metabolic panel     Status: Abnormal   Collection Time: 08/07/24  6:34 AM  Result Value Ref Range   Sodium 137 135 - 145 mmol/L   Potassium 3.9 3.5 - 5.1 mmol/L   Chloride 99 98 - 111 mmol/L   CO2 20 (L) 22 - 32 mmol/L   Glucose, Bld 186 (H) 70 - 99 mg/dL    Comment: Glucose reference range applies only to samples taken after fasting for at least 8 hours.   BUN 15 6 - 20 mg/dL   Creatinine, Ser 9.20 0.44 - 1.00 mg/dL   Calcium  9.1 8.9 - 10.3 mg/dL   GFR, Estimated >39 >39 mL/min    Comment: (NOTE) Calculated using the CKD-EPI Creatinine Equation (2021)    Anion gap 18 (H) 5 - 15    Comment: Performed at [facility], [address] [place], [place] [address]  Lipase, blood     Status: None   Collection Time: 08/08/24  3:17 AM  Result Value Ref Range   Lipase 17 11 - 51 U/L    Comment: Performed at [facility], [address] [place], [place] [address]  CBC with Differential     Status: Abnormal   Collection Time: 08/08/24  3:17 AM  Result Value Ref Range   WBC 18.9 (H) 4.0 - 10.5 K/uL   RBC 4.03 3.87 - 5.11 MIL/uL   Hemoglobin 12.3 12.0 - 15.0 g/dL   HCT 63.5 63.9 - 53.9 %   MCV 90.3 80.0 - 100.0 fL   MCH 30.5 26.0 - 34.0 pg   MCHC 33.8 30.0 - 36.0 g/dL   RDW 86.9 88.4 - 84.4 %   Platelets 339 150 - 400 K/uL   nRBC 0.0 0.0 - 0.2 %   Neutrophils Relative % 82 %   Neutro Abs 15.5 (H) 1.7 - 7.7 K/uL   Lymphocytes Relative 8 %   Lymphs Abs 1.6 0.7 - 4.0 K/uL   Monocytes Relative 9 %   Monocytes Absolute 1.7 (H) 0.1 - 1.0 K/uL   Eosinophils Relative 0 %   Eosinophils Absolute 0.0 0.0 - 0.5 K/uL   Basophils Relative 0 %   Basophils Absolute 0.1 0.0 - 0.1 K/uL   Immature Granulocytes 1 %   Abs Immature Granulocytes 0.11 (H) 0.00 - 0.07 K/uL    Comment: Performed at [facility], [address] [place], [place] [address]  Comprehensive metabolic panel     Status: Abnormal   Collection Time: 08/08/24  3:17  AM  Result Value Ref Range   Sodium 137 135 - 145 mmol/L   Potassium 3.6 3.5 - 5.1 mmol/L   Chloride 97 (L) 98 - 111 mmol/L   CO2 25 22 - 32 mmol/L   Glucose, Bld 177 (H) 70 - 99 mg/dL    Comment: Glucose reference range applies only to samples taken after fasting for at least 8 hours.   BUN 22 (H) 6 - 20 mg/dL   Creatinine, Ser 9.15 0.44 - 1.00 mg/dL   Calcium  9.2 8.9 - 10.3 mg/dL   Total Protein 7.6 6.5 - 8.1 g/dL   Albumin 4.1 3.5 - 5.0 g/dL   AST 13 (L) 15 - 41 U/L   ALT 13 0 - 44 U/L   Alkaline Phosphatase 55 38 - 126 U/L   Total Bilirubin 0.4 0.0 - 1.2 mg/dL  GFR, Estimated >60 >60 mL/min    Comment: (NOTE) Calculated using the CKD-EPI Creatinine Equation (2021)    Anion gap 15 5 - 15    Comment: Performed at [facility], [address] [place], [place] [address]  Magnesium      Status: Abnormal   Collection Time: 08/08/24  3:17 AM  Result Value Ref Range   Magnesium  2.5 (H) 1.7 - 2.4 mg/dL    Comment: Performed at [facility], [address] [place], [place] [address]  Comprehensive metabolic panel     Status: Abnormal   Collection Time: 08/09/24  3:46 AM  Result Value Ref Range   Sodium 136 135 - 145 mmol/L   Potassium 3.9 3.5 - 5.1 mmol/L    Comment: HEMOLYSIS AT THIS LEVEL MAY AFFECT RESULT   Chloride 94 (L) 98 - 111 mmol/L   CO2 24 22 - 32 mmol/L   Glucose, Bld 162 (H) 70 - 99 mg/dL    Comment: Glucose reference range applies only to samples taken after fasting for at least 8 hours.   BUN 22 (H) 6 - 20 mg/dL   Creatinine, Ser 9.28 0.44 - 1.00 mg/dL   Calcium  9.2 8.9 - 10.3 mg/dL   Total Protein 7.8 6.5 - 8.1 g/dL   Albumin 4.0 3.5 - 5.0 g/dL   AST 19 15 - 41 U/L    Comment: HEMOLYSIS AT THIS LEVEL MAY AFFECT RESULT   ALT 11 0 - 44 U/L   Alkaline Phosphatase 56 38 - 126 U/L   Total Bilirubin 0.6 0.0 - 1.2 mg/dL   GFR, Estimated >39 >39 mL/min    Comment: (NOTE) Calculated using the CKD-EPI Creatinine Equation (2021)    Anion gap 17  (H) 5 - 15    Comment: Performed at [facility], [address] [place], [place] [address]  CBC with Differential     Status: Abnormal   Collection Time: 08/09/24  3:46 AM  Result Value Ref Range   WBC 12.9 (H) 4.0 - 10.5 K/uL   RBC 4.49 3.87 - 5.11 MIL/uL   Hemoglobin 13.7 12.0 - 15.0 g/dL   HCT 59.3 63.9 - 53.9 %   MCV 90.4 80.0 - 100.0 fL   MCH 30.5 26.0 - 34.0 pg   MCHC 33.7 30.0 - 36.0 g/dL   RDW 87.4 88.4 - 84.4 %   Platelets 362 150 - 400 K/uL   nRBC 0.0 0.0 - 0.2 %   Neutrophils Relative % 71 %   Neutro Abs 9.2 (H) 1.7 - 7.7 K/uL   Lymphocytes Relative 19 %   Lymphs Abs 2.5 0.7 - 4.0 K/uL   Monocytes Relative 9 %   Monocytes Absolute 1.1 (H) 0.1 - 1.0 K/uL   Eosinophils Relative 0 %   Eosinophils Absolute 0.0 0.0 - 0.5 K/uL   Basophils Relative 0 %   Basophils Absolute 0.0 0.0 - 0.1 K/uL   Immature Granulocytes 1 %   Abs Immature Granulocytes 0.07 0.00 - 0.07 K/uL    Comment: Performed at [facility], [address] [place], [place] [address]  Lipase, blood     Status: None   Collection Time: 08/09/24  3:46 AM  Result Value Ref Range   Lipase 18 11 - 51 U/L    Comment: Performed at [facility], [address] [place], [place] [address]  Magnesium      Status: Abnormal   Collection Time: 08/09/24  3:46 AM  Result Value Ref Range  Magnesium  2.5 (H) 1.7 - 2.4 mg/dL    Comment: Performed at [facility], [address] [place], [place] [address]  Basic metabolic panel with GFR     Status: Abnormal   Collection Time: 08/14/24  2:35 PM  Result Value Ref Range   Glucose 139 (H) 70 - 99 mg/dL   BUN 14 6 - 24 mg/dL   Creatinine, Ser 9.22 0.57 - 1.00 mg/dL   eGFR 98 >40 fO/fpw/8.26   BUN/Creatinine Ratio 18 9 - 23   Sodium 135 134 - 144 mmol/L   Potassium 3.1 (L) 3.5 - 5.2 mmol/L   Chloride 90 (L) 96 - 106 mmol/L   CO2 26 20 - 29 mmol/L   Calcium  9.8 8.7 - 10.2 mg/dL      Assessment & Plan:  Increase pantoprazole  to  40 mg daily New glucometer sent in  Continue current medications  Problem List Items Addressed This Visit       Digestive   Gastroesophageal reflux disease without esophagitis   Relevant Medications   pantoprazole  (PROTONIX ) 40 MG tablet     Endocrine   Type 2 diabetes mellitus without complication, without long-term current use of insulin  (HCC) - Primary     Other   Nausea   Mixed hyperlipidemia    Return in about 4 weeks (around 09/25/2024).   Total time spent: 25 minutes. This time includes review of previous notes and results and patient face to face interaction during today's visit.    Jeoffrey Pollen, NP  08/28/2024   This document may have been prepared by [facility] Voice Recognition software and as such may include unintentional dictation errors.

## 2024-10-02 ENCOUNTER — Ambulatory Visit: Admitting: Cardiology

## 2024-11-27 ENCOUNTER — Ambulatory Visit: Admitting: Cardiology

## 2024-11-27 ENCOUNTER — Ambulatory Visit: Admitting: Internal Medicine
